# Patient Record
Sex: Female | Born: 1940 | Race: White | Hispanic: No | Smoking: Current every day smoker
Health system: Southern US, Community
[De-identification: ages and names within clinical notes are randomized; demographics above are authoritative.]

## PROBLEM LIST (undated history)

## (undated) DIAGNOSIS — C539 Malignant neoplasm of cervix uteri, unspecified: Secondary | ICD-10-CM

## (undated) DIAGNOSIS — I219 Acute myocardial infarction, unspecified: Secondary | ICD-10-CM

## (undated) DIAGNOSIS — K219 Gastro-esophageal reflux disease without esophagitis: Secondary | ICD-10-CM

## (undated) DIAGNOSIS — Z8489 Family history of other specified conditions: Secondary | ICD-10-CM

## (undated) DIAGNOSIS — Z9289 Personal history of other medical treatment: Secondary | ICD-10-CM

## (undated) DIAGNOSIS — E039 Hypothyroidism, unspecified: Secondary | ICD-10-CM

## (undated) DIAGNOSIS — R55 Syncope and collapse: Secondary | ICD-10-CM

## (undated) DIAGNOSIS — E7800 Pure hypercholesterolemia, unspecified: Secondary | ICD-10-CM

## (undated) DIAGNOSIS — J449 Chronic obstructive pulmonary disease, unspecified: Secondary | ICD-10-CM

## (undated) DIAGNOSIS — Z9981 Dependence on supplemental oxygen: Secondary | ICD-10-CM

## (undated) DIAGNOSIS — I209 Angina pectoris, unspecified: Secondary | ICD-10-CM

## (undated) DIAGNOSIS — I2510 Atherosclerotic heart disease of native coronary artery without angina pectoris: Secondary | ICD-10-CM

## (undated) DIAGNOSIS — J189 Pneumonia, unspecified organism: Secondary | ICD-10-CM

## (undated) DIAGNOSIS — M1990 Unspecified osteoarthritis, unspecified site: Secondary | ICD-10-CM

## (undated) HISTORY — PX: TONSILLECTOMY AND ADENOIDECTOMY: SUR1326

## (undated) HISTORY — PX: FRACTURE SURGERY: SHX138

## (undated) HISTORY — PX: LUNG LOBECTOMY: SHX167

## (undated) HISTORY — PX: KNEE ARTHROSCOPY: SHX127

## (undated) HISTORY — PX: THYROIDECTOMY: SHX17

## (undated) HISTORY — PX: APPENDECTOMY: SHX54

## (undated) HISTORY — PX: CARDIAC CATHETERIZATION: SHX172

---

## 1973-01-06 HISTORY — PX: TUBAL LIGATION: SHX77

## 1991-01-07 DIAGNOSIS — C539 Malignant neoplasm of cervix uteri, unspecified: Secondary | ICD-10-CM

## 1991-01-07 HISTORY — DX: Malignant neoplasm of cervix uteri, unspecified: C53.9

## 1991-01-07 HISTORY — PX: VAGINAL HYSTERECTOMY: SUR661

## 2004-09-19 ENCOUNTER — Ambulatory Visit (HOSPITAL_COMMUNITY): Admission: RE | Admit: 2004-09-19 | Discharge: 2004-09-19 | Payer: Self-pay | Admitting: Cardiovascular Disease

## 2004-09-25 ENCOUNTER — Ambulatory Visit (HOSPITAL_COMMUNITY): Admission: RE | Admit: 2004-09-25 | Discharge: 2004-09-25 | Payer: Self-pay | Admitting: Cardiovascular Disease

## 2004-09-25 ENCOUNTER — Encounter: Admission: RE | Admit: 2004-09-25 | Discharge: 2004-09-25 | Payer: Self-pay | Admitting: Cardiovascular Disease

## 2005-01-25 ENCOUNTER — Encounter: Admission: RE | Admit: 2005-01-25 | Discharge: 2005-01-25 | Payer: Self-pay | Admitting: Cardiovascular Disease

## 2005-02-25 ENCOUNTER — Ambulatory Visit: Payer: Self-pay | Admitting: Internal Medicine

## 2005-04-10 ENCOUNTER — Ambulatory Visit (HOSPITAL_COMMUNITY): Admission: RE | Admit: 2005-04-10 | Discharge: 2005-04-10 | Payer: Self-pay | Admitting: Urology

## 2005-04-15 ENCOUNTER — Ambulatory Visit: Payer: Self-pay | Admitting: Internal Medicine

## 2005-04-18 ENCOUNTER — Ambulatory Visit: Payer: Self-pay | Admitting: Internal Medicine

## 2005-05-15 ENCOUNTER — Ambulatory Visit: Payer: Self-pay | Admitting: Internal Medicine

## 2007-10-04 ENCOUNTER — Encounter: Admission: RE | Admit: 2007-10-04 | Discharge: 2007-10-04 | Payer: Self-pay | Admitting: Cardiovascular Disease

## 2007-10-11 ENCOUNTER — Encounter: Admission: RE | Admit: 2007-10-11 | Discharge: 2007-10-11 | Payer: Self-pay | Admitting: Cardiovascular Disease

## 2007-10-13 ENCOUNTER — Encounter: Admission: RE | Admit: 2007-10-13 | Discharge: 2007-10-13 | Payer: Self-pay | Admitting: Orthopedic Surgery

## 2007-11-18 ENCOUNTER — Ambulatory Visit (HOSPITAL_BASED_OUTPATIENT_CLINIC_OR_DEPARTMENT_OTHER): Admission: RE | Admit: 2007-11-18 | Discharge: 2007-11-18 | Payer: Self-pay | Admitting: Orthopedic Surgery

## 2008-10-28 ENCOUNTER — Encounter: Admission: RE | Admit: 2008-10-28 | Discharge: 2008-10-28 | Payer: Self-pay | Admitting: Cardiovascular Disease

## 2008-11-02 ENCOUNTER — Encounter: Admission: RE | Admit: 2008-11-02 | Discharge: 2008-11-02 | Payer: Self-pay | Admitting: Endocrinology

## 2008-12-06 ENCOUNTER — Encounter: Admission: RE | Admit: 2008-12-06 | Discharge: 2008-12-06 | Payer: Self-pay | Admitting: Cardiovascular Disease

## 2008-12-06 ENCOUNTER — Other Ambulatory Visit: Admission: RE | Admit: 2008-12-06 | Discharge: 2008-12-06 | Payer: Self-pay | Admitting: Interventional Radiology

## 2009-02-20 ENCOUNTER — Inpatient Hospital Stay (HOSPITAL_COMMUNITY): Admission: RE | Admit: 2009-02-20 | Discharge: 2009-02-21 | Payer: Self-pay | Admitting: Otolaryngology

## 2009-02-20 ENCOUNTER — Encounter (INDEPENDENT_AMBULATORY_CARE_PROVIDER_SITE_OTHER): Payer: Self-pay | Admitting: General Surgery

## 2009-02-27 ENCOUNTER — Encounter: Admission: RE | Admit: 2009-02-27 | Discharge: 2009-02-27 | Payer: Self-pay | Admitting: General Surgery

## 2010-03-27 LAB — BASIC METABOLIC PANEL
CO2: 28 mEq/L (ref 19–32)
Calcium: 9.1 mg/dL (ref 8.4–10.5)
Potassium: 3.5 mEq/L (ref 3.5–5.1)

## 2010-03-27 LAB — PROTIME-INR
INR: 0.96 (ref 0.00–1.49)
Prothrombin Time: 12.7 seconds (ref 11.6–15.2)

## 2010-03-27 LAB — CALCIUM: Calcium: 7.8 mg/dL — ABNORMAL LOW (ref 8.4–10.5)

## 2010-03-27 LAB — APTT: aPTT: 30 seconds (ref 24–37)

## 2010-03-27 LAB — CBC
MCV: 92.8 fL (ref 78.0–100.0)
RBC: 4.25 MIL/uL (ref 3.87–5.11)
RDW: 13.7 % (ref 11.5–15.5)
WBC: 5.1 10*3/uL (ref 4.0–10.5)

## 2010-03-27 LAB — DIFFERENTIAL
Eosinophils Relative: 2 % (ref 0–5)
Lymphocytes Relative: 25 % (ref 12–46)
Monocytes Absolute: 0.3 10*3/uL (ref 0.1–1.0)

## 2010-03-27 LAB — ALBUMIN: Albumin: 3.5 g/dL (ref 3.5–5.2)

## 2010-05-21 NOTE — Op Note (Signed)
Carly Jennings, Carly Jennings            ACCOUNT NO.:  [id number]   MEDICAL RECORD NO.:  [id number]          PATIENT TYPE:  AMB   LOCATION:  DSC                          FACILITY:  [facility]   PHYSICIAN:  Rodney A. Mortenson, M.D.DATE OF BIRTH:  November 19, 1940   DATE OF PROCEDURE:  11/18/2007  DATE OF DISCHARGE:                               OPERATIVE REPORT   JUSTIFICATION:  A 70 year old female referred here at the courtesy of  Dr. Orpah Cobb with an 81-month history of pain in her left knee, which  was become progressively worse.  She has pain in the popliteal area and  the medial joint line.  Squatting, stooping is quite painful.  She has  symptoms of mechanical problems with the knee.  There is acute  tenderness along the medial joint line.  No instability about the knee.  Because of persistent pain and discomfort, an MRI of the knee was done  that shows medial meniscal tear involving the posterior horn, which  extends through the anterior surface, but was not displaced with some  early arthritic changes in the knee.  Because of persistent pain and  discomfort, the patient now admitted for arthroscopic evaluation and  treatment.  Complication discussed preoperatively.  Questions were  answered and encouraged.   JUSTIFICATION OF PATIENT SURGERY:  Minimal morbidity.   PREOPERATIVE DIAGNOSIS:  Tear, posterior horn medial meniscus, left  knee.   POSTOPERATIVE DIAGNOSIS:  Unstable tear, posterior horn medial meniscus,  left knee; fraying leading edge, mid third lateral meniscus, left knee.   OPERATION:  Arthroscopy; debride leading edge lateral meniscus left  knee; debride posterior horn medial meniscus, left knee.   SURGEON:  Lenard Galloway. Mortenson, MD   ANESTHESIA:  General.   With the arthroscope of the knee, a very careful examination of the knee  was undertaken.  The patellofemoral joint appeared absolutely normal.  The ACL was normal.  In the lateral compartment, there was normal  articular cartilage over the lateral femoral condyle and lateral tibial  plateau, but there is fraying and tearing of the leading edge of lateral  meniscus, but this did not extend into the body.  In the medial  compartment, there was normal articular cartilage of the femoral condyle  and tibial plateau and initially, the medial meniscus looked absolutely  normal, but was unstable on palpation with a probe.  The meniscus was  left off and there was a big tear on the undersurface of the meniscus,  which extended into the superior surface and the meniscus could be  dragged into the joint.   PROCEDURE:  The patient was placed on the operating table in the supine  position with a pneumatic tourniquet above the left upper thigh.  The  entire left lower extremity was prepped with DuraPrep and draped out in  the usual manner.  An infusion cannula was placed in the superior medial  pouch and the knee distended with saline.  Anteromedial and  anterolateral portals were made and the arthroscope was introduced.  Attention was first turned to the lateral compartment.  The leading edge  of the lateral meniscus was frayed  and this was debrided with the  basket.  This was followed up with intraarticular shaver.  All debris  were removed and the remaining meniscus was smoothed and balanced.  About 75% width of the meniscus and mid third was preserved and the rest  meniscus was normal.  Attention then turned to the medial compartment.  Again posterior horn was very unstable.  A series of baskets were  inserted through both portals and this was extensively debrided.  This  was followed up with the intraarticular shaver.  All debris were  removed.  The remaining rim was then smoothed and balanced a nice  transition to mid third of the medial meniscus.  Excellent decompression  of the tear was achieved.  The knee was then filled with Marcaine.  A  large bulky pressure dressing applied and the patient  returned to  recovery room in excellent condition.  Technically, this procedure went  extremely well.   DISPOSITION:  1. Usual postoperative instructions.  2. Percocet for pain.  3. To my office on Wednesday next week and we will start physical      therapy.      Rodney A. Chaney Malling, M.D.  Electronically Signed     RAM/MEDQ  D:  11/18/2007  T:  11/18/2007  Job:  [id number]   cc:   Ricki Rodriguez, M.D.

## 2010-05-24 NOTE — Cardiovascular Report (Signed)
Carly Jennings, Carly Jennings            ACCOUNT NO.:  [id number]   MEDICAL RECORD NO.:  [id number]          PATIENT TYPE:  OIB   LOCATION:  2899                         FACILITY:  [facility]   PHYSICIAN:  Ricki Rodriguez, M.D.  DATE OF BIRTH:  September 13, 1940   DATE OF PROCEDURE:  09/19/2004  DATE OF DISCHARGE:                              CARDIAC CATHETERIZATION   HOSPITAL LOCATION:  Outpatient Department.   Left heart catheterization, selective coronary angiography, left ventricular  function study.   INDICATION:  This 70 year old white female had chest pressure with abnormal  EKG, hypertension, COPD and hyperlipidemia off and on for the last 6 months.   APPROACH:  Right femoral artery using 5 French sheath and 4 French  catheters.   COMPLICATIONS:  None.   HEMODYNAMIC DATA:  The left ventricular pressure was 123/12, aortic pressure  was 125/70.   LEFT VENTRICULOGRAM:  The left ventriculogram showed mild generalized  hypokinesia with ejection fraction of 50-55%.   CORONARY ANATOMY:  The left main coronary artery was unremarkable.   Left anterior descending coronary artery:  The left anterior descending  coronary artery showed normal vessel which wrapped around the apex of the  heart, supplying half of the posterior septum.  It was elongated due to  chronic obstructive lung disease and the diagonal 1 and 2 vessels were  unremarkable.   Left circumflex coronary artery:  The left circumflex coronary artery was  essentially unremarkable.   Right coronary artery:  The right coronary artery was dominant, had a large  posterolateral branch and had a small posterior descending coronary artery.   IMPRESSION:  1.  Normal coronary arteries.  2.  Mild left ventricular systolic dysfunction.  3.  Noncardiac chest pain.   RECOMMENDATION:  This patient will undergo noncardiac chest pain on an  outpatient basis.      Ricki Rodriguez, M.D.  Electronically Signed     ASK/MEDQ  D:   09/19/2004  T:  09/19/2004  Job:  [id number]

## 2010-12-19 IMAGING — CT CT NECK W/ CM
4 of 5 series · 16 of 33 positions shown, 19 images · IV contrast (75CC OMNI 300)
Comparison: None.

CLINICAL DATA: Thyroidectomy 02/20/2009.  Gasping for air.
Evaluate airway.

CT NECK WITH CONTRAST
TECHNIQUE: Multidetector CT imaging of the neck was performed with
intravenous contrast.
Contrast: 75 ml [id number]-XNN IV.

[Series 3: axial neck · axial · 0.37mm/px · z∈[-229,-114]mm · 3 of 115 slices shown (1 of 2)]
[im 23/115  bone]
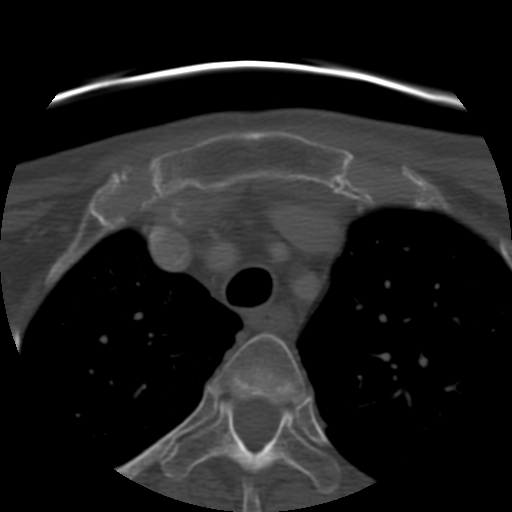
[im 46/115  bone]
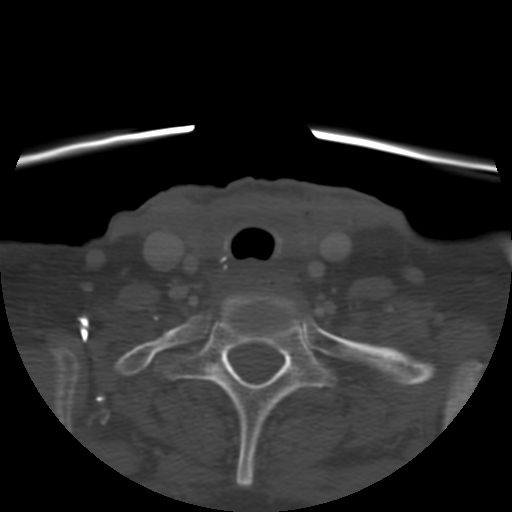
[im 69/115  bone]
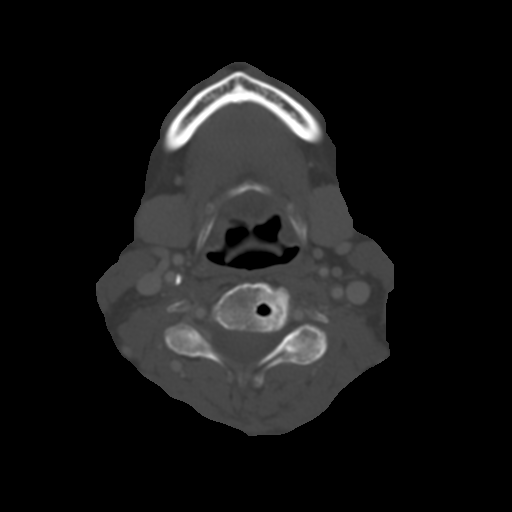

[Series 200: cor neck · coronal · 0.57mm/px · 3 of 74 slices shown]
[im 15/74  bone]
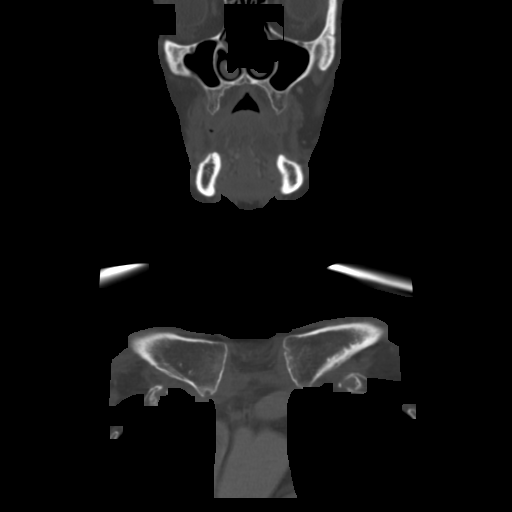
[im 30/74  bone]
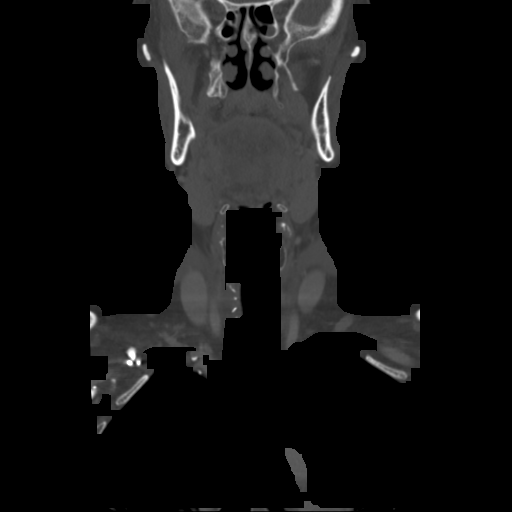
[im 44/74  bone]
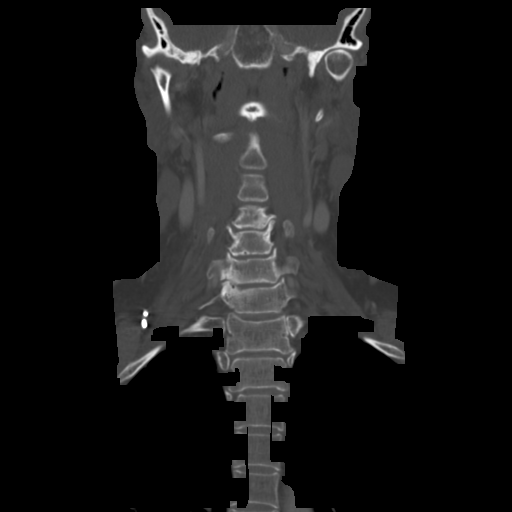

[Series 201: sag neck · sagittal · 0.57mm/px · 5 of 83 slices shown, 6 images]
[im 28/83  bone]
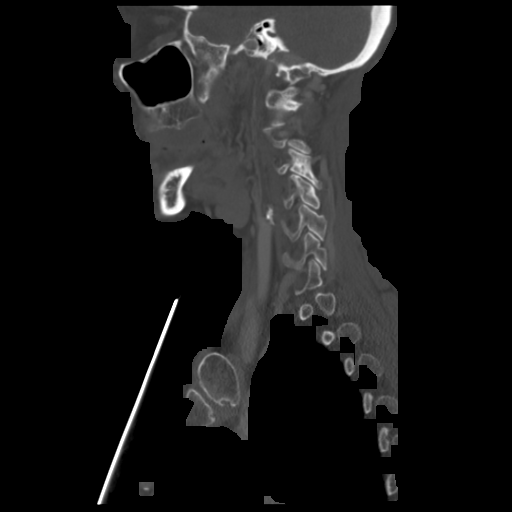
[im 35/83  bone]
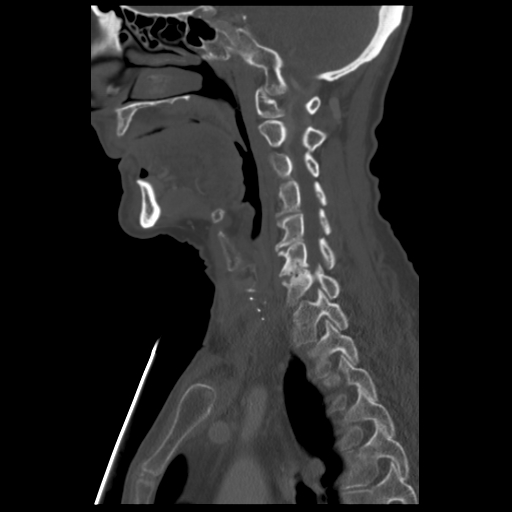
[im 42/83  soft-tissue]
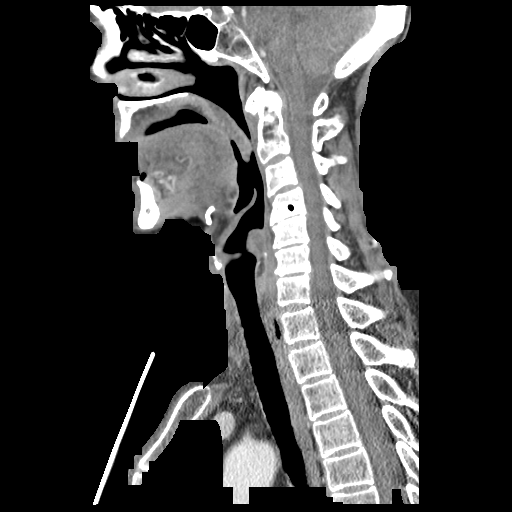
[im 42/83  bone]
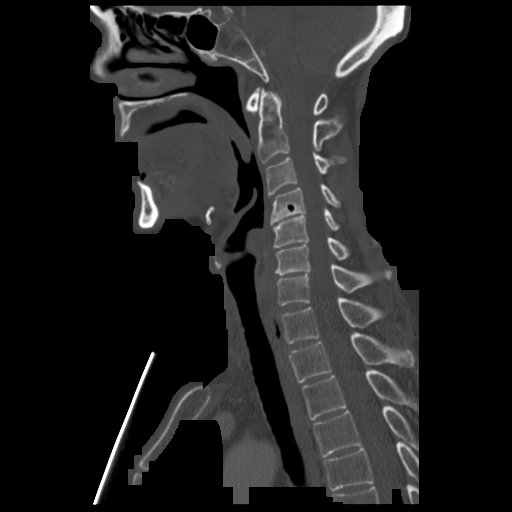
[im 48/83  bone]
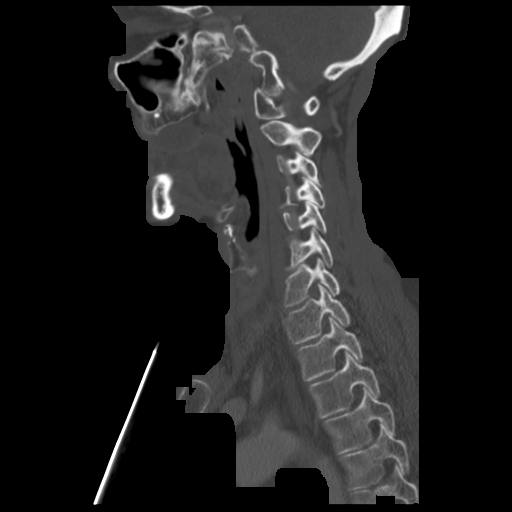
[im 55/83  bone]
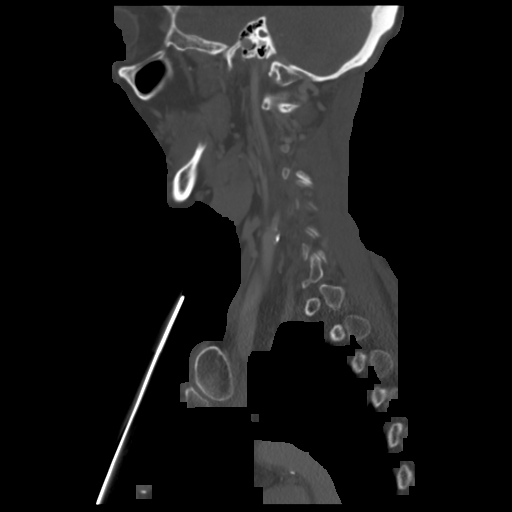

[Series 202: axial neck · axial · 0.39mm/px · z∈[-270,-62]mm · 5 of 157 slices shown, 7 images (2 of 2)]
[im 27/157  soft-tissue]
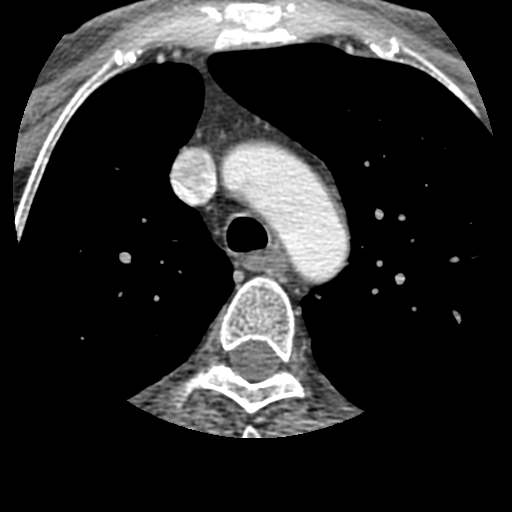
[im 27/157  bone]
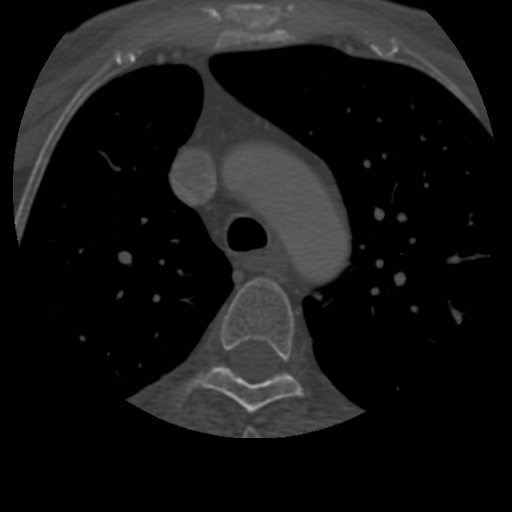
[im 53/157  bone]
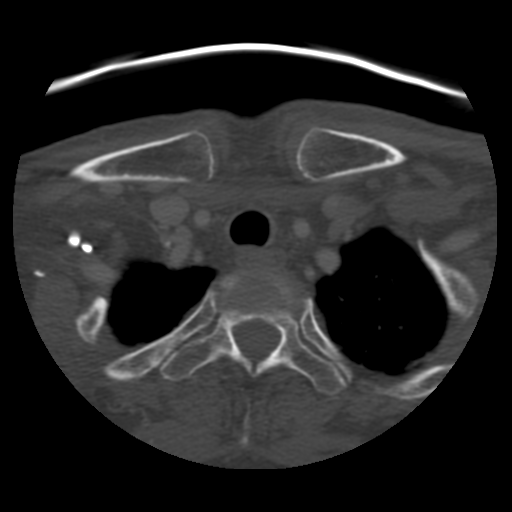
[im 79/157  bone]
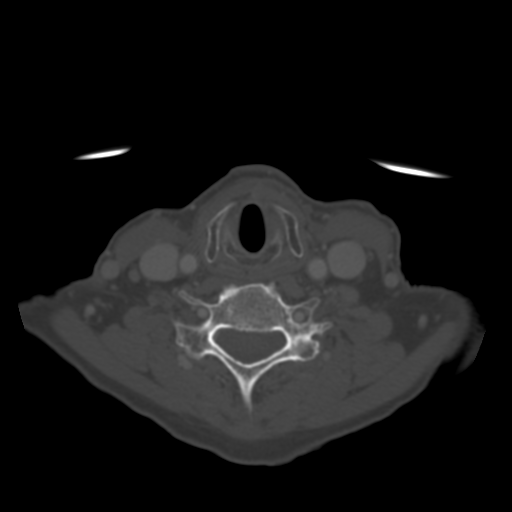
[im 105/157  bone]
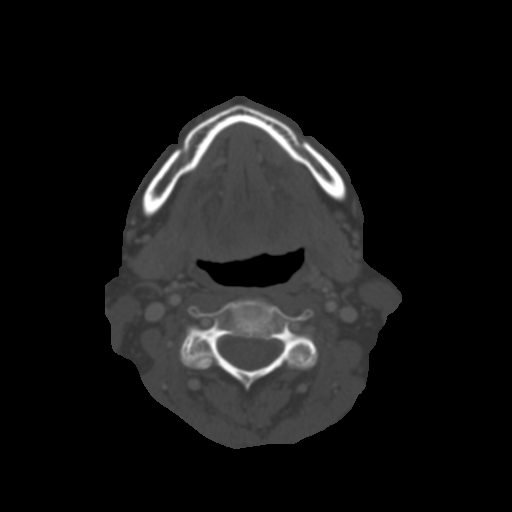
[im 131/157  soft-tissue]
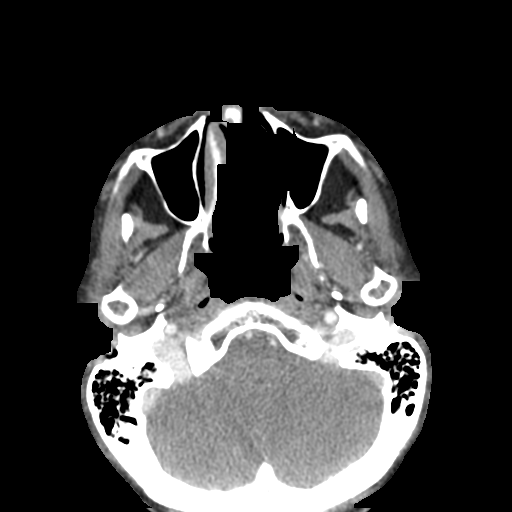
[im 131/157  bone]
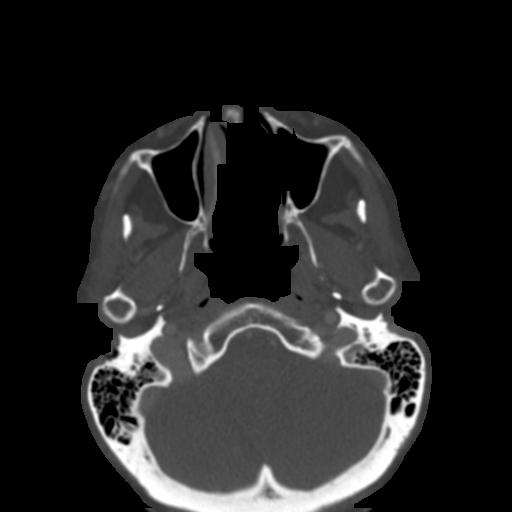

[16 of 33 positions shown; findings below may reference images not displayed]

FINDINGS: There appears to have been total thyroidectomy.  There is
a mild amount of fluid density in the thyroid bed bilaterally.
This fluid density measures approximately 10 x 17 mm on the right
and 9 x 16 mm on the left.  This also extends across midline in the
isthmus region.  This appears to be a simple postoperative
collection.  There is no mass effect on the trachea and I do not
believe this represents an abscess.  The remainder of the airway is
normal without mass effect or significant narrowing.  The larynx is
normal.

The oral cavity and pharynx is normal.  The salivary glands are
normal.  There is no pathologic adenopathy.  There is significant
atherosclerotic calcification in the carotid arteries bilaterally.
Cervical disc degeneration and spondylosis at C4-5, C5-6, and C6-7.
Jugular vein is patent bilaterally.
IMPRESSION: Status post bilateral thyroidectomy.  Fluid density is present in
the thyroid bed bilaterally.  This appears to be simple
postoperative fluid, abscess is not felt to be present.  There is
no mass effect on the airway.

## 2012-12-12 ENCOUNTER — Encounter (HOSPITAL_COMMUNITY): Payer: Self-pay

## 2012-12-12 ENCOUNTER — Inpatient Hospital Stay (HOSPITAL_COMMUNITY)
Admission: AD | Admit: 2012-12-12 | Discharge: 2012-12-13 | DRG: 287 | Disposition: A | Discharge status: Home / Self Care | Payer: Medicare Other | Source: Other Acute Inpatient Hospital | Attending: Cardiovascular Disease | Admitting: Cardiovascular Disease

## 2012-12-12 DIAGNOSIS — Z79899 Other long term (current) drug therapy: Secondary | ICD-10-CM

## 2012-12-12 DIAGNOSIS — J4489 Other specified chronic obstructive pulmonary disease: Secondary | ICD-10-CM | POA: Diagnosis present

## 2012-12-12 DIAGNOSIS — I249 Acute ischemic heart disease, unspecified: Secondary | ICD-10-CM | POA: Diagnosis present

## 2012-12-12 DIAGNOSIS — R0789 Other chest pain: Principal | ICD-10-CM | POA: Diagnosis present

## 2012-12-12 DIAGNOSIS — Z7982 Long term (current) use of aspirin: Secondary | ICD-10-CM

## 2012-12-12 DIAGNOSIS — I959 Hypotension, unspecified: Secondary | ICD-10-CM | POA: Diagnosis present

## 2012-12-12 DIAGNOSIS — F329 Major depressive disorder, single episode, unspecified: Secondary | ICD-10-CM | POA: Diagnosis present

## 2012-12-12 DIAGNOSIS — E039 Hypothyroidism, unspecified: Secondary | ICD-10-CM | POA: Diagnosis present

## 2012-12-12 DIAGNOSIS — J449 Chronic obstructive pulmonary disease, unspecified: Secondary | ICD-10-CM | POA: Diagnosis present

## 2012-12-12 DIAGNOSIS — F411 Generalized anxiety disorder: Secondary | ICD-10-CM | POA: Diagnosis present

## 2012-12-12 DIAGNOSIS — F3289 Other specified depressive episodes: Secondary | ICD-10-CM | POA: Diagnosis present

## 2012-12-12 HISTORY — DX: Angina pectoris, unspecified: I20.9

## 2012-12-12 HISTORY — DX: Atherosclerotic heart disease of native coronary artery without angina pectoris: I25.10

## 2012-12-12 HISTORY — DX: Chronic obstructive pulmonary disease, unspecified: J44.9

## 2012-12-12 HISTORY — DX: Acute myocardial infarction, unspecified: I21.9

## 2012-12-12 LAB — URINE MICROSCOPIC-ADD ON

## 2012-12-12 LAB — HEPARIN LEVEL (UNFRACTIONATED): Heparin Unfractionated: 0.52 IU/mL (ref 0.30–0.70)

## 2012-12-12 LAB — BASIC METABOLIC PANEL
CO2: 22 mEq/L (ref 19–32)
Chloride: 104 mEq/L (ref 96–112)
Creatinine, Ser: 0.7 mg/dL (ref 0.50–1.10)
GFR calc Af Amer: >90 mL/min (ref >90)
GFR calc non Af Amer: 86 mL/min — ABNORMAL LOW (ref >90)
Potassium: 3.5 mEq/L (ref 3.5–5.1)

## 2012-12-12 LAB — PROTIME-INR
INR: 1.02 (ref 0.00–1.49)
Prothrombin Time: 13.2 seconds (ref 11.6–15.2)

## 2012-12-12 LAB — TROPONIN I
Troponin I: <0.3 ng/mL (ref <0.30)
Troponin I: <0.3 ng/mL (ref <0.30)

## 2012-12-12 LAB — LIPID PANEL
Cholesterol: 136 mg/dL (ref 0–200)
LDL Cholesterol: 74 mg/dL (ref 0–99)
Triglycerides: 45 mg/dL (ref <150)
VLDL: 9 mg/dL (ref 0–40)

## 2012-12-12 LAB — URINALYSIS, ROUTINE W REFLEX MICROSCOPIC
Glucose, UA: NEGATIVE mg/dL
Ketones, ur: NEGATIVE mg/dL
Nitrite: NEGATIVE
Protein, ur: NEGATIVE mg/dL
Urobilinogen, UA: 1 mg/dL (ref 0.0–1.0)

## 2012-12-12 LAB — CBC
Platelets: 230 10*3/uL (ref 150–400)
RBC: 4.09 MIL/uL (ref 3.87–5.11)
RDW: 13.1 % (ref 11.5–15.5)
WBC: 5.6 10*3/uL (ref 4.0–10.5)

## 2012-12-12 MED ORDER — IPRATROPIUM-ALBUTEROL 20-100 MCG/ACT IN AERS
2.0000 | INHALATION_SPRAY | Freq: Four times a day (QID) | RESPIRATORY_TRACT | Status: DC
  Administered 2012-12-12: 2 via RESPIRATORY_TRACT
  Filled 2012-12-12: qty 4

## 2012-12-12 MED ORDER — SODIUM CHLORIDE 0.9 % IV SOLN: 250.0000 mL | INTRAVENOUS | Status: DC | PRN

## 2012-12-12 MED ORDER — ALPRAZOLAM 0.25 MG PO TABS: 0.2500 mg | ORAL_TABLET | Freq: Two times a day (BID) | ORAL | Status: DC | PRN

## 2012-12-12 MED ORDER — SODIUM CHLORIDE 0.9 % IJ SOLN: 3.0000 mL | Freq: Two times a day (BID) | INTRAMUSCULAR | Status: DC

## 2012-12-12 MED ORDER — SODIUM CHLORIDE 0.9 % IJ SOLN: 3.0000 mL | INTRAMUSCULAR | Status: DC | PRN

## 2012-12-12 MED ORDER — DIAZEPAM 5 MG PO TABS
5.0000 mg | ORAL_TABLET | ORAL | Status: DC
  Filled 2012-12-12: qty 1

## 2012-12-12 MED ORDER — ACETAMINOPHEN 325 MG PO TABS
650.0000 mg | ORAL_TABLET | ORAL | Status: DC | PRN
  Administered 2012-12-12: 650 mg via ORAL
  Filled 2012-12-12: qty 2

## 2012-12-12 MED ORDER — ALBUTEROL SULFATE (5 MG/ML) 0.5% IN NEBU: 2.5000 mg | INHALATION_SOLUTION | Freq: Four times a day (QID) | RESPIRATORY_TRACT | Status: DC | PRN

## 2012-12-12 MED ORDER — HEPARIN (PORCINE) IN NACL 100-0.45 UNIT/ML-% IJ SOLN
1000.0000 [IU]/h | INTRAMUSCULAR | Status: DC
  Administered 2012-12-12 (×2): 1000 [IU]/h via INTRAVENOUS
  Filled 2012-12-12 (×4): qty 250

## 2012-12-12 MED ORDER — SODIUM CHLORIDE 0.9 % IJ SOLN
3.0000 mL | Freq: Two times a day (BID) | INTRAMUSCULAR | Status: DC
  Administered 2012-12-12: 3 mL via INTRAVENOUS

## 2012-12-12 MED ORDER — ONDANSETRON HCL 4 MG/2ML IJ SOLN: 4.0000 mg | Freq: Four times a day (QID) | INTRAMUSCULAR | Status: DC | PRN

## 2012-12-12 MED ORDER — TRAZODONE HCL 100 MG PO TABS
100.0000 mg | ORAL_TABLET | Freq: Every day | ORAL | Status: DC
  Administered 2012-12-12 (×2): 100 mg via ORAL
  Filled 2012-12-12 (×3): qty 1

## 2012-12-12 MED ORDER — ASPIRIN EC 81 MG PO TBEC: 81.0000 mg | DELAYED_RELEASE_TABLET | Freq: Every day | ORAL | Status: DC

## 2012-12-12 MED ORDER — SODIUM CHLORIDE 0.9 % IV SOLN
INTRAVENOUS | Status: DC
  Administered 2012-12-12: 10:00:00 via INTRAVENOUS
  Administered 2012-12-13: 50 mL via INTRAVENOUS

## 2012-12-12 MED ORDER — IPRATROPIUM-ALBUTEROL 20-100 MCG/ACT IN AERS
2.0000 | INHALATION_SPRAY | Freq: Two times a day (BID) | RESPIRATORY_TRACT | Status: DC
  Administered 2012-12-12 – 2012-12-13 (×2): 2 via RESPIRATORY_TRACT

## 2012-12-12 MED ORDER — ASPIRIN 81 MG PO CHEW: 324.0000 mg | CHEWABLE_TABLET | ORAL | Status: AC

## 2012-12-12 MED ORDER — METOPROLOL TARTRATE 25 MG PO TABS
25.0000 mg | ORAL_TABLET | Freq: Two times a day (BID) | ORAL | Status: DC
  Administered 2012-12-12: 25 mg via ORAL
  Filled 2012-12-12 (×5): qty 1

## 2012-12-12 MED ORDER — LEVOTHYROXINE SODIUM 200 MCG PO TABS
200.0000 ug | ORAL_TABLET | Freq: Every day | ORAL | Status: DC
  Administered 2012-12-12 – 2012-12-13 (×2): 200 ug via ORAL
  Filled 2012-12-12 (×3): qty 1

## 2012-12-12 MED ORDER — HEPARIN BOLUS VIA INFUSION
4000.0000 [IU] | Freq: Once | INTRAVENOUS | Status: AC
  Administered 2012-12-12: 4000 [IU] via INTRAVENOUS
  Filled 2012-12-12: qty 4000

## 2012-12-12 MED ORDER — PANTOPRAZOLE SODIUM 40 MG PO TBEC
40.0000 mg | DELAYED_RELEASE_TABLET | Freq: Every day | ORAL | Status: DC
  Administered 2012-12-12 – 2012-12-13 (×2): 40 mg via ORAL
  Filled 2012-12-12 (×2): qty 1

## 2012-12-12 MED ORDER — NITROGLYCERIN 0.4 MG SL SUBL: 0.4000 mg | SUBLINGUAL_TABLET | SUBLINGUAL | Status: DC | PRN

## 2012-12-12 MED ORDER — AMLODIPINE BESYLATE 2.5 MG PO TABS
2.5000 mg | ORAL_TABLET | Freq: Every day | ORAL | Status: DC
  Filled 2012-12-12: qty 1

## 2012-12-12 MED ORDER — ASPIRIN 300 MG RE SUPP
300.0000 mg | RECTAL | Status: AC
  Filled 2012-12-12: qty 1

## 2012-12-12 MED ORDER — ATORVASTATIN CALCIUM 40 MG PO TABS
40.0000 mg | ORAL_TABLET | Freq: Every day | ORAL | Status: DC
  Administered 2012-12-12: 40 mg via ORAL
  Filled 2012-12-12 (×2): qty 1

## 2012-12-12 NOTE — Progress Notes (Signed)
ANTICOAGULATION CONSULT NOTE - Initial Consult  Pharmacy Consult for heparin Indication: chest pain/ACS  Allergies not on file  Patient Measurements: Height: 5\' 11"  (180.3 cm) Weight: 174 lb 9.7 oz (79.2 kg) IBW/kg (Calculated) : 70.8  Vital Signs: Temp: 98.1 F (36.7 C) (12/07 0000) Temp src: Oral (12/07 0000) BP: 113/52 mmHg (12/07 0000)    Assessment: 72yo female comes from OSH c/o CP, initial troponin negative, to begin heparin.  Goal of Therapy:  Heparin level 0.3-0.7 units/ml Monitor platelets by anticoagulation protocol: Yes   Plan:  Will give heparin 4000 units IV bolus x1 followed by gtt at 1000 units/hr and monitor heparin levels and CBC.  Vernard Gambles, PharmD, BCPS  12/12/2012,12:35 AM

## 2012-12-12 NOTE — Progress Notes (Signed)
ANTICOAGULATION CONSULT NOTE - Follow up Consult  Pharmacy Consult for heparin Indication: chest pain/ACS  Allergies  Allergen Reactions  . Penicillins Anaphylaxis  . Packed Cells     Reaction to blood transfusion in 93    Patient Measurements: Height: 5\' 11"  (180.3 cm) Weight: 174 lb 9.7 oz (79.2 kg) IBW/kg (Calculated) : 70.8  Vital Signs: Temp: 98.6 F (37 C) (12/07 0748) Temp src: Oral (12/07 0748) BP: 89/47 mmHg (12/07 1030) Pulse Rate: 60 (12/07 0800)    Assessment: 72yo female comes from OSH c/o CP.  Heparin drip 1000 uts/hr HL 0.52.  CBC stable, Tp neg x3.  no bleedig noted.  BP soft 90/40. RUE45, statin, metop, plan cath in am.    Goal of Therapy:  Heparin level 0.3-0.7 units/ml Monitor platelets by anticoagulation protocol: Yes   Plan:  Heparin drip 1000 units/hr   Daily heparin levels and CBC    Leota Sauers Pharm.D. CPP, BCPS Clinical Pharmacist [phone] 12/12/2012 11:14 AM

## 2012-12-12 NOTE — Progress Notes (Signed)
  Echocardiogram 2D Echocardiogram has been performed.  Georgian Co 12/12/2012, 10:17 AM

## 2012-12-12 NOTE — Progress Notes (Signed)
Called to room due to IV bleeding from site. Assessed and IV removed, catheter intact and dressing applied. Heparin and fluids were on hold for roughly 15 minutes while restarting another site. Will continue to monitor new site.

## 2012-12-12 NOTE — Progress Notes (Signed)
Subjective:  Feeling better. Low blood pressure. Cardiac enzymes normal x 2. Afebrile.  Objective:  Vital Signs in the last 24 hours: Temp:  [97.8 F (36.6 C)-98.6 F (37 C)] 98.6 F (37 C) (12/07 0748) Pulse Rate:  [60-73] 60 (12/07 0800) Cardiac Rhythm:  [-] Heart block (12/07 0800) Resp:  [11-17] 16 (12/07 0900) BP: (80-117)/(39-72) 94/44 mmHg (12/07 0900) SpO2:  [92 %-99 %] 96 % (12/07 0900) Weight:  [79.2 kg (174 lb 9.7 oz)] 79.2 kg (174 lb 9.7 oz) (12/07 0500)  Physical Exam: BP Readings from Last 1 Encounters:  12/12/12 94/44     Wt Readings from Last 1 Encounters:  12/12/12 79.2 kg (174 lb 9.7 oz)    Weight change:   HEENT: [place]/AT, Eyes-PERL, EOMI, Conjunctiva-Pink, Sclera-Non-icteric Neck: No JVD, No bruit, Trachea midline. Lungs:  Clear, Bilateral. Cardiac:  Regular rhythm, normal S1 and S2, no S3.  Abdomen:  Soft, non-tender. Extremities:  No edema present. No cyanosis. No clubbing. CNS: AxOx3, Cranial nerves grossly intact, moves all 4 extremities. Right handed. Skin: Warm and dry.   Intake/Output from previous day: 12/06 0701 - 12/07 0700 In: 180.7 [P.O.:120; I.V.:60.7] Out: 200 [Urine:200]    Lab Results: BMET    Component Value Date/Time   NA 140 12/12/2012 0140   K 3.5 12/12/2012 0140   CL 104 12/12/2012 0140   CO2 22 12/12/2012 0140   GLUCOSE 107* 12/12/2012 0140   BUN 19 12/12/2012 0140   CREATININE 0.70 12/12/2012 0140   CALCIUM 8.9 12/12/2012 0140   GFRNONAA 86* 12/12/2012 0140   GFRAA >90 12/12/2012 0140   CBC    Component Value Date/Time   WBC 5.6 12/12/2012 0140   RBC 4.09 12/12/2012 0140   HGB 12.4 12/12/2012 0140   HCT 37.5 12/12/2012 0140   PLT 230 12/12/2012 0140   MCV 91.7 12/12/2012 0140   MCH 30.3 12/12/2012 0140   MCHC 33.1 12/12/2012 0140   RDW 13.1 12/12/2012 0140   LYMPHSABS 1.3 02/14/2009 1113   MONOABS 0.3 02/14/2009 1113   EOSABS 0.1 02/14/2009 1113   BASOSABS 0.0 02/14/2009 1113   CARDIAC ENZYMES Lab Results  Component Value Date   TROPONINI <0.30 12/12/2012    Scheduled Meds: . aspirin  324 mg Oral NOW   Or  . aspirin  300 mg Rectal NOW  . [START ON 12/13/2012] aspirin EC  81 mg Oral Daily  . atorvastatin  40 mg Oral q1800  . Ipratropium-Albuterol  2 puff Inhalation BID  . levothyroxine  200 mcg Oral QAC breakfast  . metoprolol tartrate  25 mg Oral BID  . pantoprazole  40 mg Oral Daily  . sodium chloride  3 mL Intravenous Q12H  . traZODone  100 mg Oral QHS   Continuous Infusions: . sodium chloride    . heparin 1,000 Units/hr (12/12/12 0056)   PRN Meds:.sodium chloride, acetaminophen, albuterol, ALPRAZolam, nitroGLYCERIN, ondansetron (ZOFRAN) IV, sodium chloride  Assessment/Plan: Chest pain R/O CAD COPD Hypothyroidism  Cardiac cath in AM. DC amlodipine. IV fluids Echocardiogram for LV function.   LOS: 0 days    Redith Drach  MD  12/12/2012, 9:20 AM      

## 2012-12-12 NOTE — H&P (Signed)
Carly Jennings is an 72 y.o. female.   Chief Complaint: Chest pain HPI: 72 year old female with recurrent retrosternal chest pain assocoated with sweating spell and shortness of breath.   Past medical history : No diabetes, + hypothyroidism, + smoking, No alcohol or drug use. No hyperlipidemia, No family history of premature coronary artery disease.    No past surgical history on file.:Cardiac cath-2006-Normal coronaries. Left knee surgery 11/18/2007, Thyroid surgery-02/20/2009  No family history on file. Social History:  has no alcohol, and drug history on file. + smoking 1 pk cigarette per day.    Allergies: Allergies not on file  No prescriptions prior to admission    No results found for this or any previous visit (from the past 48 hour(s)). No results found.  ROS No weight gain, loss. Wears glasses, No hearing loss. No GI or GU bleed. No stroke or seizures. + COPD, + Chest pain Blood pressure 113/52, temperature 98.1 F (36.7 C), temperature source Oral, resp. rate 16, height 5\' 11"  (1.803 m), weight 79.2 kg (174 lb 9.7 oz), SpO2 93.00%. General appearance: alert, appears stated age and mild distress Head: Normocephalic, without obvious abnormality, atraumatic Eyes: conjunctivae/corneas clear. PERRL, EOM's intact. Neck: no adenopathy, no carotid bruit, no JVD, thyroid not palpable Resp: clear to auscultation bilaterally Cardio: regular rate and rhythm, S1, S2 normal, no murmur, click, rub or gallop GI: soft, non-tender; bowel sounds normal; no masses,  no organomegaly Extremities: extremities normal, atraumatic, no cyanosis or edema Skin: Warm and dry.  Assessment/Plan Chest pain R/O CAD COPD Hypothyroidism  Cardiac cath on Monday.  Tyffani Foglesong S 12/12/2012, 12:36 AM

## 2012-12-13 ENCOUNTER — Encounter (HOSPITAL_COMMUNITY)
Admission: AD | Disposition: A | Discharge status: Home / Self Care | Payer: Self-pay | Source: Other Acute Inpatient Hospital | Attending: Cardiovascular Disease

## 2012-12-13 HISTORY — PX: LEFT HEART CATHETERIZATION WITH CORONARY ANGIOGRAM: SHX5451

## 2012-12-13 LAB — CBC
HCT: 34.4 % — ABNORMAL LOW (ref 36.0–46.0)
MCH: 29.7 pg (ref 26.0–34.0)
MCHC: 32.6 g/dL (ref 30.0–36.0)
MCV: 91.2 fL (ref 78.0–100.0)
Platelets: 197 10*3/uL (ref 150–400)
RBC: 3.77 MIL/uL — ABNORMAL LOW (ref 3.87–5.11)

## 2012-12-13 LAB — PROTIME-INR
INR: 1.02 (ref 0.00–1.49)
Prothrombin Time: 13.2 seconds (ref 11.6–15.2)

## 2012-12-13 LAB — BASIC METABOLIC PANEL
BUN: 21 mg/dL (ref 6–23)
CO2: 25 mEq/L (ref 19–32)
Calcium: 8.2 mg/dL — ABNORMAL LOW (ref 8.4–10.5)
Creatinine, Ser: 0.82 mg/dL (ref 0.50–1.10)
GFR calc non Af Amer: 71 mL/min — ABNORMAL LOW (ref >90)
Sodium: 141 mEq/L (ref 135–145)

## 2012-12-13 SURGERY — LEFT HEART CATHETERIZATION WITH CORONARY ANGIOGRAM
Anesthesia: LOCAL

## 2012-12-13 MED ORDER — ALPRAZOLAM 0.25 MG PO TABS: 0.2500 mg | ORAL_TABLET | Freq: Two times a day (BID) | ORAL | Status: DC | PRN

## 2012-12-13 MED ORDER — SODIUM CHLORIDE 0.9 % IV SOLN: INTRAVENOUS | Status: DC

## 2012-12-13 MED ORDER — MECLIZINE HCL 25 MG PO TABS: 25.0000 mg | ORAL_TABLET | Freq: Three times a day (TID) | ORAL | Status: AC | PRN

## 2012-12-13 MED ORDER — ACETAMINOPHEN 325 MG PO TABS: 650.0000 mg | ORAL_TABLET | ORAL | Status: DC | PRN

## 2012-12-13 MED ORDER — TRAZODONE HCL 100 MG PO TABS: 100.0000 mg | ORAL_TABLET | Freq: Two times a day (BID) | ORAL | Status: AC

## 2012-12-13 MED ORDER — LIDOCAINE HCL (PF) 1 % IJ SOLN
INTRAMUSCULAR | Status: AC
  Filled 2012-12-13: qty 30

## 2012-12-13 MED ORDER — ASPIRIN EC 81 MG PO TBEC
81.0000 mg | DELAYED_RELEASE_TABLET | Freq: Every day | ORAL | Status: DC
  Administered 2012-12-13: 81 mg via ORAL
  Filled 2012-12-13: qty 1

## 2012-12-13 MED ORDER — IPRATROPIUM-ALBUTEROL 18-103 MCG/ACT IN AERO: 2.0000 | INHALATION_SPRAY | Freq: Four times a day (QID) | RESPIRATORY_TRACT | Status: AC | PRN

## 2012-12-13 MED ORDER — ASPIRIN 81 MG PO CHEW
CHEWABLE_TABLET | ORAL | Status: AC
  Filled 2012-12-13: qty 1

## 2012-12-13 MED ORDER — LEVOTHYROXINE SODIUM 200 MCG PO TABS: 200.0000 ug | ORAL_TABLET | Freq: Every day | ORAL | Status: AC

## 2012-12-13 MED ORDER — ALBUTEROL SULFATE (2.5 MG/3ML) 0.083% IN NEBU: 2.5000 mg | INHALATION_SOLUTION | Freq: Four times a day (QID) | RESPIRATORY_TRACT | Status: AC | PRN

## 2012-12-13 MED ORDER — ONDANSETRON HCL 4 MG/2ML IJ SOLN: 4.0000 mg | Freq: Four times a day (QID) | INTRAMUSCULAR | Status: DC | PRN

## 2012-12-13 MED ORDER — METOPROLOL TARTRATE 25 MG PO TABS: 25.0000 mg | ORAL_TABLET | Freq: Two times a day (BID) | ORAL | Status: AC

## 2012-12-13 MED ORDER — LUBIPROSTONE 24 MCG PO CAPS: 24.0000 ug | ORAL_CAPSULE | Freq: Two times a day (BID) | ORAL | Status: DC

## 2012-12-13 MED ORDER — ATORVASTATIN CALCIUM 40 MG PO TABS: 40.0000 mg | ORAL_TABLET | Freq: Every day | ORAL | Status: AC

## 2012-12-13 MED ORDER — NITROGLYCERIN 0.2 MG/ML ON CALL CATH LAB
INTRAVENOUS | Status: AC
  Filled 2012-12-13: qty 1

## 2012-12-13 MED ORDER — FLUTICASONE-SALMETEROL 100-50 MCG/DOSE IN AEPB: 1.0000 | INHALATION_SPRAY | Freq: Two times a day (BID) | RESPIRATORY_TRACT | Status: AC

## 2012-12-13 MED ORDER — RANITIDINE HCL 75 MG PO TABS: 75.0000 mg | ORAL_TABLET | Freq: Every day | ORAL | Status: AC | PRN

## 2012-12-13 MED ORDER — AMLODIPINE BESYLATE 2.5 MG PO TABS: 2.5000 mg | ORAL_TABLET | Freq: Every day | ORAL | Status: AC

## 2012-12-13 MED ORDER — FENTANYL CITRATE 0.05 MG/ML IJ SOLN
INTRAMUSCULAR | Status: AC
  Filled 2012-12-13: qty 2

## 2012-12-13 MED ORDER — DIPHENHYDRAMINE HCL 25 MG PO TABS: 25.0000 mg | ORAL_TABLET | ORAL | Status: AC | PRN

## 2012-12-13 MED ORDER — MIDAZOLAM HCL 2 MG/2ML IJ SOLN
INTRAMUSCULAR | Status: AC
  Filled 2012-12-13: qty 2

## 2012-12-13 MED ORDER — HEPARIN (PORCINE) IN NACL 2-0.9 UNIT/ML-% IJ SOLN
INTRAMUSCULAR | Status: AC
  Filled 2012-12-13: qty 1500

## 2012-12-13 NOTE — CV Procedure (Signed)
PROCEDURE:  Left heart catheterization with selective coronary angiography, left ventriculogram.  CLINICAL HISTORY:  This is a 72 year old female had typical angina with shortness of breath and sweating spell with cardiac risk factor of smoking.  The risks, benefits, and details of the procedure were explained to the patient.  The patient verbalized understanding and wanted to proceed.  Informed written consent was obtained.  PROCEDURE TECHNIQUE:  The patient was approached from the right femoral artery using a 5 French short sheath.  Left coronary angiography was done using a Judkins L4 guide catheter.  Right coronary angiography was done using a Judkins R4 guide catheter.  Left ventriculography was done using a pigtail catheter.    CONTRAST:  Total of 45 cc.  COMPLICATIONS:  None.  At the end of the procedure a manual device was used for hemostasis.    HEMODYNAMICS:  Aortic pressure was 113/60; LV pressure was 116/11; LVEDP 15.  There was no gradient between the left ventricle and aorta.    ANGIOGRAM/CORONARY ARTERIOGRAM:   The left main coronary artery is unremarkable.  The left anterior descending artery has proximal luminal irregularities only. Diagonal vessel is unremarkable.  The left circumflex artery is unremarkable.  The right coronary artery is dominant and unremarkable.  LEFT VENTRICULOGRAM:  Left ventricular angiogram was done in the 30 RAO projection and revealed mild generalized hypokinesia of left ventricular wall with an estimated ejection fraction of 50%.  LVEDP was 15 mmHg.  IMPRESSION OF HEART CATHETERIZATION:   1. Normal left main coronary artery. 2. Minimal disease of left anterior descending artery and normal diagonal branches. 3. Normal left circumflex artery and its branches. 4. Normal right coronary artery. 5. Mild left ventricular systolic dysfunction.  LVEDP 15 mmHg.  Ejection fraction 50%.  RECOMMENDATION:   Medical treatment of non-cardiac chest  pain.

## 2012-12-13 NOTE — Progress Notes (Signed)
Reviewed discharge instructions with patient. IVs removed and patient awaiting transportation to home.  Carly Jennings, Charlaine Dalton RN

## 2012-12-13 NOTE — H&P (View-Only) (Signed)
Subjective:  Feeling better. Low blood pressure. Cardiac enzymes normal x 2. Afebrile.  Objective:  Vital Signs in the last 24 hours: Temp:  [97.8 F (36.6 C)-98.6 F (37 C)] 98.6 F (37 C) (12/07 0748) Pulse Rate:  [60-73] 60 (12/07 0800) Cardiac Rhythm:  [-] Heart block (12/07 0800) Resp:  [11-17] 16 (12/07 0900) BP: (80-117)/(39-72) 94/44 mmHg (12/07 0900) SpO2:  [92 %-99 %] 96 % (12/07 0900) Weight:  [79.2 kg (174 lb 9.7 oz)] 79.2 kg (174 lb 9.7 oz) (12/07 0500)  Physical Exam: BP Readings from Last 1 Encounters:  12/12/12 94/44     Wt Readings from Last 1 Encounters:  12/12/12 79.2 kg (174 lb 9.7 oz)    Weight change:   HEENT: [place]/AT, Eyes-PERL, EOMI, Conjunctiva-Pink, Sclera-Non-icteric Neck: No JVD, No bruit, Trachea midline. Lungs:  Clear, Bilateral. Cardiac:  Regular rhythm, normal S1 and S2, no S3.  Abdomen:  Soft, non-tender. Extremities:  No edema present. No cyanosis. No clubbing. CNS: AxOx3, Cranial nerves grossly intact, moves all 4 extremities. Right handed. Skin: Warm and dry.   Intake/Output from previous day: 12/06 0701 - 12/07 0700 In: 180.7 [P.O.:120; I.V.:60.7] Out: 200 [Urine:200]    Lab Results: BMET    Component Value Date/Time   NA 140 12/12/2012 0140   K 3.5 12/12/2012 0140   CL 104 12/12/2012 0140   CO2 22 12/12/2012 0140   GLUCOSE 107* 12/12/2012 0140   BUN 19 12/12/2012 0140   CREATININE 0.70 12/12/2012 0140   CALCIUM 8.9 12/12/2012 0140   GFRNONAA 86* 12/12/2012 0140   GFRAA >90 12/12/2012 0140   CBC    Component Value Date/Time   WBC 5.6 12/12/2012 0140   RBC 4.09 12/12/2012 0140   HGB 12.4 12/12/2012 0140   HCT 37.5 12/12/2012 0140   PLT 230 12/12/2012 0140   MCV 91.7 12/12/2012 0140   MCH 30.3 12/12/2012 0140   MCHC 33.1 12/12/2012 0140   RDW 13.1 12/12/2012 0140   LYMPHSABS 1.3 02/14/2009 1113   MONOABS 0.3 02/14/2009 1113   EOSABS 0.1 02/14/2009 1113   BASOSABS 0.0 02/14/2009 1113   CARDIAC ENZYMES Lab Results  Component Value Date   TROPONINI <0.30 12/12/2012    Scheduled Meds: . aspirin  324 mg Oral NOW   Or  . aspirin  300 mg Rectal NOW  . [START ON 12/13/2012] aspirin EC  81 mg Oral Daily  . atorvastatin  40 mg Oral q1800  . Ipratropium-Albuterol  2 puff Inhalation BID  . levothyroxine  200 mcg Oral QAC breakfast  . metoprolol tartrate  25 mg Oral BID  . pantoprazole  40 mg Oral Daily  . sodium chloride  3 mL Intravenous Q12H  . traZODone  100 mg Oral QHS   Continuous Infusions: . sodium chloride    . heparin 1,000 Units/hr (12/12/12 0056)   PRN Meds:.sodium chloride, acetaminophen, albuterol, ALPRAZolam, nitroGLYCERIN, ondansetron (ZOFRAN) IV, sodium chloride  Assessment/Plan: Chest pain R/O CAD COPD Hypothyroidism  Cardiac cath in AM. DC amlodipine. IV fluids Echocardiogram for LV function.   LOS: 0 days    Orpah Cobb  MD  12/12/2012, 9:20 AM

## 2012-12-13 NOTE — Interval H&P Note (Signed)
History and Physical Interval Note:  12/13/2012 7:28 AM  Carly Jennings  has presented today for surgery, with the diagnosis of Typical angina  The various methods of treatment have been discussed with the patient and family. After consideration of risks, benefits and other options for treatment, the patient has consented to  Procedure(s): LEFT HEART CATHETERIZATION WITH CORONARY ANGIOGRAM (N/A) as a surgical intervention .  The patient's history has been reviewed, patient examined, no change in status, stable for surgery.  I have reviewed the patient's chart and labs.  Questions were answered to the patient's satisfaction.     [name] S

## 2012-12-13 NOTE — Progress Notes (Addendum)
ANTICOAGULATION CONSULT NOTE - Follow Up Consult  Pharmacy Consult:  Heparin Indication: chest pain/ACS  Allergies  Allergen Reactions  . Penicillins Anaphylaxis  . Packed Cells     Reaction to blood transfusion in 93    Patient Measurements: Height: 5\' 11"  (180.3 cm) Weight: 178 lb 2.1 oz (80.8 kg) IBW/kg (Calculated) : 70.8 Heparin Dosing Weight: 81 kg  Vital Signs: Temp: 98.6 F (37 C) (12/08 0700) Temp src: Oral (12/08 0700) BP: 97/44 mmHg (12/08 1100) Pulse Rate: 54 (12/08 0801)  Labs:  Recent Labs  12/12/12 0140 12/12/12 0655 12/12/12 0900 12/12/12 1650 12/13/12 0508  HGB 12.4  --   --   --  11.2*  HCT 37.5  --   --   --  34.4*  PLT 230  --   --   --  197  LABPROT 13.2  --   --   --  13.2  INR 1.02  --   --   --  1.02  HEPARINUNFRC  --   --  0.52  --  0.37  CREATININE 0.70  --   --   --  0.82  TROPONINI <0.30 <0.30  --  <0.30  --     Estimated Creatinine Clearance: 71.4 ml/min (by C-G formula based on Cr of 0.82).      Assessment: 24 YOF admitted with chest pain to continue on IV heparin.  Heparin level therapeutic.  No further bleeding reported.  Note plan for cath today.   Goal of Therapy:  Heparin level 0.3-0.7 units/ml Monitor platelets by anticoagulation protocol: Yes    Plan:  - Continue IV heparin at 1100 units/hr - Daily HL / CBC - F/U post cath    Shyler Hamill D. Laney Potash, PharmD, BCPS Pager:  [phone] 12/13/2012, 11:25 AM  ==========================================  Addendum: - patient went to cath in early AM - heparin gtt has been off since 0700   Plan: - f/u with order to d/c heparin gtt (still on profile) - f/u cath note    Eunice Winecoff D. Laney Potash, PharmD, BCPS Pager:  [phone] 12/13/2012, 3:27 PM

## 2012-12-13 NOTE — Discharge Summary (Signed)
Physician Discharge Summary  Patient ID: Carly Jennings MRN: [id number] DOB/AGE: November 06, 1940 72 y.o.  Admit date: 12/12/2012 Discharge date: 12/13/2012  Admission Diagnoses: Chest pain  R/O CAD  COPD  Hypothyroidism  Discharge Diagnoses:  Principle Problem: * Chest pain * Early CAD  COPD  Hypothyroidism Anxiety Depression  Discharged Condition: fair  Hospital Course: 72 year old female was admitted with recurrent retrosternal chest pain associated with sweating spell and shortness of breath. She underwent cardiac catheterization with near normal coronaries. She was discharged home in stable condition with follow up by me in 1 week.   Consults: None  Significant Diagnostic Studies: labs: Normal CBC, BMET and cardiac enzymes. Normal Lipid panel with LDL cholesterol of 74 mg/dL and HDL of 53 mg/dL Cardiac cath near normal. EKG-NSR, left axis deviation non-specific T wave changes.  Treatments: IV hydration, IV heparin, cardiac meds: metoprolol, amlodipine and atorvastatin and alprazolam, albuterol nebulizer treatment.  Discharge Exam: Blood pressure 92/38, pulse 56, temperature 98.2 F (36.8 C), temperature source Oral, resp. rate 17, height 5\' 11"  (1.803 m), weight 80.8 kg (178 lb 2.1 oz), SpO2 98.00%.   Disposition: 01, Home, Self care.     Medication List    STOP taking these medications       aspirin EC 81 MG tablet      TAKE these medications       albuterol (2.5 MG/3ML) 0.083% nebulizer solution  Commonly known as:  PROVENTIL  Take 3 mLs (2.5 mg total) by nebulization every 6 (six) hours as needed for wheezing or shortness of breath.     albuterol-ipratropium 18-103 MCG/ACT inhaler  Commonly known as:  COMBIVENT  Inhale 2 puffs into the lungs every 6 (six) hours as needed for wheezing or shortness of breath.     ALPRAZolam 0.25 MG tablet  Commonly known as:  XANAX  Take 1 tablet (0.25 mg total) by mouth 2 (two) times daily as needed for anxiety.     amLODipine 2.5 MG tablet  Commonly known as:  NORVASC  Take 1 tablet (2.5 mg total) by mouth daily.     atorvastatin 40 MG tablet  Commonly known as:  LIPITOR  Take 1 tablet (40 mg total) by mouth daily.     diphenhydrAMINE 25 MG tablet  Commonly known as:  BENADRYL  Take 1 tablet (25 mg total) by mouth as needed for allergies.     Fluticasone-Salmeterol 100-50 MCG/DOSE Aepb  Commonly known as:  ADVAIR  Inhale 1 puff into the lungs 2 (two) times daily.     levothyroxine 200 MCG tablet  Commonly known as:  SYNTHROID, LEVOTHROID  Take 1 tablet (200 mcg total) by mouth daily before breakfast.     lubiprostone 24 MCG capsule  Commonly known as:  AMITIZA  Take 1 capsule (24 mcg total) by mouth 2 (two) times daily with a meal.     meclizine 25 MG tablet  Commonly known as:  ANTIVERT  Take 1 tablet (25 mg total) by mouth 3 (three) times daily as needed for dizziness.     metoprolol tartrate 25 MG tablet  Commonly known as:  LOPRESSOR  Take 1 tablet (25 mg total) by mouth 2 (two) times daily.     ranitidine 75 MG tablet  Commonly known as:  ZANTAC  Take 1 tablet (75 mg total) by mouth daily as needed for heartburn.     traZODone 100 MG tablet  Commonly known as:  DESYREL  Take 1 tablet (100 mg total) by  mouth 2 (two) times daily.           Follow-up Information   Follow up with [facility], MD. Schedule an appointment as soon as possible for a visit in 1 week.   Specialty:  Cardiology   Contact information:   [address] [place] [address] [phone]       Signed: Ricki Rodriguez 12/13/2012, 6:06 PM

## 2012-12-13 NOTE — Progress Notes (Signed)
Spoke to Dr Algie Coffer about the discharge instructions. Patient is to see md in the office this week to modify her medicines.   Carly Jennings, Carly Dalton RN

## 2012-12-13 NOTE — Progress Notes (Signed)
Utilization Review Completed.Vitoria Conyer T12/08/2012  

## 2013-01-06 HISTORY — PX: CATARACT EXTRACTION, BILATERAL: SHX1313

## 2013-05-06 HISTORY — PX: PARTIAL HIP ARTHROPLASTY: SHX733

## 2013-12-15 ENCOUNTER — Encounter (HOSPITAL_COMMUNITY): Payer: Self-pay | Admitting: Cardiovascular Disease

## 2014-08-22 ENCOUNTER — Observation Stay (HOSPITAL_COMMUNITY): Payer: PPO

## 2014-08-22 ENCOUNTER — Encounter (HOSPITAL_COMMUNITY): Payer: Self-pay | Admitting: General Practice

## 2014-08-22 ENCOUNTER — Observation Stay (HOSPITAL_COMMUNITY)
Admission: AD | Admit: 2014-08-22 | Discharge: 2014-08-23 | Disposition: A | Discharge status: Home / Self Care | Payer: PPO | Source: Ambulatory Visit | Attending: Cardiovascular Disease | Admitting: Cardiovascular Disease

## 2014-08-22 DIAGNOSIS — D638 Anemia in other chronic diseases classified elsewhere: Secondary | ICD-10-CM | POA: Insufficient documentation

## 2014-08-22 DIAGNOSIS — E876 Hypokalemia: Secondary | ICD-10-CM | POA: Insufficient documentation

## 2014-08-22 DIAGNOSIS — R55 Syncope and collapse: Principal | ICD-10-CM

## 2014-08-22 DIAGNOSIS — I6522 Occlusion and stenosis of left carotid artery: Secondary | ICD-10-CM | POA: Insufficient documentation

## 2014-08-22 DIAGNOSIS — E039 Hypothyroidism, unspecified: Secondary | ICD-10-CM | POA: Diagnosis not present

## 2014-08-22 DIAGNOSIS — E785 Hyperlipidemia, unspecified: Secondary | ICD-10-CM | POA: Insufficient documentation

## 2014-08-22 DIAGNOSIS — Z880 Allergy status to penicillin: Secondary | ICD-10-CM | POA: Insufficient documentation

## 2014-08-22 DIAGNOSIS — Z6820 Body mass index (BMI) 20.0-20.9, adult: Secondary | ICD-10-CM | POA: Insufficient documentation

## 2014-08-22 DIAGNOSIS — J449 Chronic obstructive pulmonary disease, unspecified: Secondary | ICD-10-CM | POA: Diagnosis not present

## 2014-08-22 DIAGNOSIS — Z859 Personal history of malignant neoplasm, unspecified: Secondary | ICD-10-CM | POA: Diagnosis not present

## 2014-08-22 DIAGNOSIS — I2510 Atherosclerotic heart disease of native coronary artery without angina pectoris: Secondary | ICD-10-CM | POA: Diagnosis not present

## 2014-08-22 DIAGNOSIS — F17210 Nicotine dependence, cigarettes, uncomplicated: Secondary | ICD-10-CM | POA: Insufficient documentation

## 2014-08-22 DIAGNOSIS — J439 Emphysema, unspecified: Secondary | ICD-10-CM | POA: Diagnosis present

## 2014-08-22 DIAGNOSIS — E440 Moderate protein-calorie malnutrition: Secondary | ICD-10-CM | POA: Insufficient documentation

## 2014-08-22 DIAGNOSIS — F419 Anxiety disorder, unspecified: Secondary | ICD-10-CM | POA: Diagnosis not present

## 2014-08-22 DIAGNOSIS — I252 Old myocardial infarction: Secondary | ICD-10-CM | POA: Insufficient documentation

## 2014-08-22 DIAGNOSIS — Z888 Allergy status to other drugs, medicaments and biological substances status: Secondary | ICD-10-CM | POA: Diagnosis not present

## 2014-08-22 DIAGNOSIS — Z79899 Other long term (current) drug therapy: Secondary | ICD-10-CM | POA: Insufficient documentation

## 2014-08-22 HISTORY — DX: Personal history of other medical treatment: Z92.89

## 2014-08-22 HISTORY — DX: Hypothyroidism, unspecified: E03.9

## 2014-08-22 HISTORY — DX: Syncope and collapse: R55

## 2014-08-22 HISTORY — DX: Unspecified osteoarthritis, unspecified site: M19.90

## 2014-08-22 HISTORY — DX: Pure hypercholesterolemia, unspecified: E78.00

## 2014-08-22 HISTORY — DX: Pneumonia, unspecified organism: J18.9

## 2014-08-22 HISTORY — DX: Family history of other specified conditions: Z84.89

## 2014-08-22 HISTORY — DX: Gastro-esophageal reflux disease without esophagitis: K21.9

## 2014-08-22 HISTORY — DX: Malignant neoplasm of cervix uteri, unspecified: C53.9

## 2014-08-22 HISTORY — DX: Dependence on supplemental oxygen: Z99.81

## 2014-08-22 LAB — URINALYSIS, ROUTINE W REFLEX MICROSCOPIC
BILIRUBIN URINE: NEGATIVE
Glucose, UA: NEGATIVE mg/dL
HGB URINE DIPSTICK: NEGATIVE
Ketones, ur: NEGATIVE mg/dL
Leukocytes, UA: NEGATIVE
Nitrite: NEGATIVE
PH: 5.5 (ref 5.0–8.0)
Protein, ur: NEGATIVE mg/dL
SPECIFIC GRAVITY, URINE: 1.014 (ref 1.005–1.030)
UROBILINOGEN UA: 1 mg/dL (ref 0.0–1.0)

## 2014-08-22 LAB — CBC WITH DIFFERENTIAL/PLATELET
BASOS PCT: 1 % (ref 0–1)
Basophils Absolute: 0 10*3/uL (ref 0.0–0.1)
Eosinophils Absolute: 0.2 10*3/uL (ref 0.0–0.7)
Eosinophils Relative: 2 % (ref 0–5)
HEMATOCRIT: 34.5 % — AB (ref 36.0–46.0)
HEMOGLOBIN: 11.6 g/dL — AB (ref 12.0–15.0)
LYMPHS ABS: 0.9 10*3/uL (ref 0.7–4.0)
LYMPHS PCT: 12 % (ref 12–46)
MCH: 30.9 pg (ref 26.0–34.0)
MCHC: 33.6 g/dL (ref 30.0–36.0)
MCV: 91.8 fL (ref 78.0–100.0)
MONO ABS: 0.6 10*3/uL (ref 0.1–1.0)
MONOS PCT: 8 % (ref 3–12)
NEUTROS ABS: 5.9 10*3/uL (ref 1.7–7.7)
NEUTROS PCT: 77 % (ref 43–77)
Platelets: 222 10*3/uL (ref 150–400)
RBC: 3.76 MIL/uL — ABNORMAL LOW (ref 3.87–5.11)
RDW: 13.3 % (ref 11.5–15.5)
WBC: 7.6 10*3/uL (ref 4.0–10.5)

## 2014-08-22 LAB — COMPREHENSIVE METABOLIC PANEL
ALBUMIN: 3.3 g/dL — AB (ref 3.5–5.0)
ALK PHOS: 135 U/L — AB (ref 38–126)
ALT: 55 U/L — ABNORMAL HIGH (ref 14–54)
ANION GAP: 11 (ref 5–15)
AST: 40 U/L (ref 15–41)
BUN: 10 mg/dL (ref 6–20)
CALCIUM: 8.4 mg/dL — AB (ref 8.9–10.3)
CHLORIDE: 102 mmol/L (ref 101–111)
CO2: 27 mmol/L (ref 22–32)
Creatinine, Ser: 0.73 mg/dL (ref 0.44–1.00)
GFR calc non Af Amer: >60 mL/min (ref >60)
GLUCOSE: 93 mg/dL (ref 65–99)
Potassium: 2.9 mmol/L — ABNORMAL LOW (ref 3.5–5.1)
SODIUM: 140 mmol/L (ref 135–145)
Total Bilirubin: 0.5 mg/dL (ref 0.3–1.2)
Total Protein: 6.3 g/dL — ABNORMAL LOW (ref 6.5–8.1)

## 2014-08-22 LAB — TSH: TSH: 9.123 u[IU]/mL — AB (ref 0.350–4.500)

## 2014-08-22 MED ORDER — ALUM & MAG HYDROXIDE-SIMETH 200-200-20 MG/5ML PO SUSP: 30.0000 mL | Freq: Four times a day (QID) | ORAL | Status: DC | PRN

## 2014-08-22 MED ORDER — SODIUM CHLORIDE 0.9 % IJ SOLN
3.0000 mL | Freq: Two times a day (BID) | INTRAMUSCULAR | Status: DC
Start: 2014-08-22 — End: 2014-08-23
  Administered 2014-08-22 – 2014-08-23 (×2): 3 mL via INTRAVENOUS

## 2014-08-22 MED ORDER — ONDANSETRON HCL 4 MG PO TABS: 4.0000 mg | ORAL_TABLET | Freq: Four times a day (QID) | ORAL | Status: DC | PRN

## 2014-08-22 MED ORDER — ASPIRIN EC 81 MG PO TBEC
81.0000 mg | DELAYED_RELEASE_TABLET | Freq: Every day | ORAL | Status: DC
  Administered 2014-08-23: 81 mg via ORAL
  Filled 2014-08-22: qty 1

## 2014-08-22 MED ORDER — ACETAMINOPHEN 650 MG RE SUPP
650.0000 mg | Freq: Four times a day (QID) | RECTAL | Status: DC | PRN
  Filled 2014-08-22: qty 1

## 2014-08-22 MED ORDER — ACETAMINOPHEN 325 MG PO TABS: 650.0000 mg | ORAL_TABLET | Freq: Four times a day (QID) | ORAL | Status: DC | PRN

## 2014-08-22 MED ORDER — ONDANSETRON HCL 4 MG/2ML IJ SOLN: 4.0000 mg | Freq: Four times a day (QID) | INTRAMUSCULAR | Status: DC | PRN

## 2014-08-22 MED ORDER — OXYCODONE HCL 5 MG PO TABS
5.0000 mg | ORAL_TABLET | ORAL | Status: DC | PRN
  Administered 2014-08-22: 5 mg via ORAL
  Filled 2014-08-22: qty 1

## 2014-08-22 MED ORDER — ADULT MULTIVITAMIN W/MINERALS CH
1.0000 | ORAL_TABLET | Freq: Every day | ORAL | Status: DC
  Administered 2014-08-23: 1 via ORAL
  Filled 2014-08-22: qty 1

## 2014-08-22 MED ORDER — HEPARIN SODIUM (PORCINE) 5000 UNIT/ML IJ SOLN
5000.0000 [IU] | Freq: Three times a day (TID) | INTRAMUSCULAR | Status: DC
Start: 2014-08-22 — End: 2014-08-23
  Administered 2014-08-22 – 2014-08-23 (×2): 5000 [IU] via SUBCUTANEOUS
  Filled 2014-08-22 (×4): qty 1

## 2014-08-22 MED ORDER — DOCUSATE SODIUM 100 MG PO CAPS
100.0000 mg | ORAL_CAPSULE | Freq: Two times a day (BID) | ORAL | Status: DC
  Administered 2014-08-22 – 2014-08-23 (×2): 100 mg via ORAL
  Filled 2014-08-22 (×3): qty 1

## 2014-08-22 MED ORDER — SODIUM CHLORIDE 0.9 % IV SOLN
INTRAVENOUS | Status: DC
  Administered 2014-08-22: 17:00:00 via INTRAVENOUS

## 2014-08-22 MED ORDER — ENSURE ENLIVE PO LIQD
237.0000 mL | Freq: Two times a day (BID) | ORAL | Status: DC
  Administered 2014-08-23: 237 mL via ORAL

## 2014-08-22 NOTE — Progress Notes (Signed)
Patient has arrived to unit as direct admit to room 3E06. Patient alert and oriented x4 with no complaints at this time. Patient placed on cardiac monitor, CCMD notified. Patient oriented to unit and room. Call bell and telephone in reach. Dr Doylene Canard made aware of patients arrival

## 2014-08-22 NOTE — H&P (Signed)
Referring Physician:  JERRY HAUGEN is an 74 y.o. female.                       Chief Complaint: Syncope  HPI: 74 year old female with PMH of End stage COPD, hypothyroidism, smoking, abnormal weight loss had syncopal episode 5 days back lasting for few minutes. No chest pain or palpitation.    Past Medical History  Diagnosis Date  . Anginal pain   . Coronary artery disease   . Myocardial infarction   . COPD (chronic obstructive pulmonary disease)   . Cancer       Past Surgical History  Procedure Laterality Date  . Tonsillectomy    . Other surgical history      adnoidectomy  . Left heart catheterization with coronary angiogram N/A 12/13/2012    Procedure: LEFT HEART CATHETERIZATION WITH CORONARY ANGIOGRAM;  Surgeon: Birdie Riddle, MD;  Location: [facility]ATH [facility];  Service: Cardiovascular;  Laterality: N/A;    No family history on file. Social History:  reports that she has been smoking Cigarettes.  She does not have any smokeless tobacco history on file. She reports that she does not drink alcohol or use illicit drugs.  Allergies:  Allergies  Allergen Reactions  . Penicillins Anaphylaxis  . Packed Cells     Reaction to blood transfusion in 93    Medications Prior to Admission  Medication Sig Dispense Refill  . albuterol (PROVENTIL) (2.5 MG/3ML) 0.083% nebulizer solution Take 3 mLs (2.5 mg total) by nebulization every 6 (six) hours as needed for wheezing or shortness of breath. 75 mL 0  . albuterol-ipratropium (COMBIVENT) 18-103 MCG/ACT inhaler Inhale 2 puffs into the lungs every 6 (six) hours as needed for wheezing or shortness of breath.    . ALPRAZolam (XANAX) 0.25 MG tablet Take 1 tablet (0.25 mg total) by mouth 2 (two) times daily as needed for anxiety. 30 tablet 0  . amLODipine (NORVASC) 2.5 MG tablet Take 1 tablet (2.5 mg total) by mouth daily.    Marland Kitchen atorvastatin (LIPITOR) 40 MG tablet Take 1 tablet (40 mg total) by mouth daily.    . diphenhydrAMINE (BENADRYL) 25 MG  tablet Take 1 tablet (25 mg total) by mouth as needed for allergies. 30 tablet 0  . Fluticasone-Salmeterol (ADVAIR) 100-50 MCG/DOSE AEPB Inhale 1 puff into the lungs 2 (two) times daily. 60 each   . levothyroxine (SYNTHROID, LEVOTHROID) 200 MCG tablet Take 1 tablet (200 mcg total) by mouth daily before breakfast.    . lubiprostone (AMITIZA) 24 MCG capsule Take 1 capsule (24 mcg total) by mouth 2 (two) times daily with a meal.    . meclizine (ANTIVERT) 25 MG tablet Take 1 tablet (25 mg total) by mouth 3 (three) times daily as needed for dizziness. 30 tablet 0  . metoprolol tartrate (LOPRESSOR) 25 MG tablet Take 1 tablet (25 mg total) by mouth 2 (two) times daily. 60 tablet 3  . ranitidine (ZANTAC) 75 MG tablet Take 1 tablet (75 mg total) by mouth daily as needed for heartburn.    . traZODone (DESYREL) 100 MG tablet Take 1 tablet (100 mg total) by mouth 2 (two) times daily.      No results found for this or any previous visit (from the past 48 hour(s)). No results found.  Review Of Systems No weight gain, loss. Wears glasses, No hearing loss. No GI or GU bleed. No stroke or seizures. + COPD, + Chest pain  Blood pressure 116/74,  pulse 74, temperature 97.5 F (36.4 C), temperature source Oral, resp. rate 18, height 5' 11.5" (1.816 m), weight 65.726 kg (144 lb 14.4 oz), SpO2 100 %. General appearance: alert, appears stated age and mild respiratory distress Head: Normocephalic, without obvious abnormality, atraumatic Eyes: conjunctivae/corneas clear. PERRL, EOM's intact. Neck: no adenopathy, no carotid bruit, no JVD, thyroid not palpable Resp: clear to auscultation bilaterally Cardio: regular rate and rhythm, S1, S2 normal, no click, rub or gallop. II/VI systolic murmur. GI: soft, non-tender; bowel sounds normal; no masses, no organomegaly Extremities: extremities normal, atraumatic, no cyanosis or edema Skin: Warm and dry.  Assessment/Plan Syncope COPD with  emphysema Hypothyroidism Protein calorie malnutrition, severe Tobacco use disorder Dyslipidemia  Place in observation Telemetry Echocardiogram Carotid doppler Home medications.  Birdie Riddle, MD  08/22/2014, 4:27 PM

## 2014-08-23 ENCOUNTER — Ambulatory Visit (HOSPITAL_COMMUNITY): Payer: PPO

## 2014-08-23 DIAGNOSIS — E440 Moderate protein-calorie malnutrition: Secondary | ICD-10-CM | POA: Insufficient documentation

## 2014-08-23 DIAGNOSIS — R55 Syncope and collapse: Secondary | ICD-10-CM | POA: Diagnosis not present

## 2014-08-23 LAB — BASIC METABOLIC PANEL
ANION GAP: 8 (ref 5–15)
BUN: 17 mg/dL (ref 6–20)
CHLORIDE: 105 mmol/L (ref 101–111)
CO2: 27 mmol/L (ref 22–32)
Calcium: 8.1 mg/dL — ABNORMAL LOW (ref 8.9–10.3)
Creatinine, Ser: 1.11 mg/dL — ABNORMAL HIGH (ref 0.44–1.00)
GFR calc Af Amer: 57 mL/min — ABNORMAL LOW (ref >60)
GFR, EST NON AFRICAN AMERICAN: 49 mL/min — AB (ref >60)
GLUCOSE: 89 mg/dL (ref 65–99)
POTASSIUM: 3.2 mmol/L — AB (ref 3.5–5.1)
Sodium: 140 mmol/L (ref 135–145)

## 2014-08-23 LAB — FERRITIN: Ferritin: 165 ng/mL (ref 11–307)

## 2014-08-23 LAB — IRON AND TIBC
Iron: 34 ug/dL (ref 28–170)
SATURATION RATIOS: 16 % (ref 10.4–31.8)
TIBC: 216 ug/dL — AB (ref 250–450)
UIBC: 182 ug/dL

## 2014-08-23 LAB — CBC
HEMATOCRIT: 32.6 % — AB (ref 36.0–46.0)
HEMOGLOBIN: 10.8 g/dL — AB (ref 12.0–15.0)
MCH: 30.3 pg (ref 26.0–34.0)
MCHC: 33.1 g/dL (ref 30.0–36.0)
MCV: 91.3 fL (ref 78.0–100.0)
Platelets: 204 10*3/uL (ref 150–400)
RBC: 3.57 MIL/uL — AB (ref 3.87–5.11)
RDW: 13.4 % (ref 11.5–15.5)
WBC: 5.5 10*3/uL (ref 4.0–10.5)

## 2014-08-23 LAB — PROTIME-INR
INR: 1.07 (ref 0.00–1.49)
PROTHROMBIN TIME: 14.1 s (ref 11.6–15.2)

## 2014-08-23 MED ORDER — LUBIPROSTONE 24 MCG PO CAPS: 24.0000 ug | ORAL_CAPSULE | Freq: Every day | ORAL | Status: AC | PRN

## 2014-08-23 MED ORDER — POTASSIUM CHLORIDE ER 20 MEQ PO TBCR: 10.0000 meq | EXTENDED_RELEASE_TABLET | Freq: Every day | ORAL | Status: AC

## 2014-08-23 MED ORDER — ENSURE ENLIVE PO LIQD: 60.0000 | Freq: Two times a day (BID) | ORAL | Status: AC

## 2014-08-23 MED ORDER — ASPIRIN 81 MG PO TBEC: 81.0000 mg | DELAYED_RELEASE_TABLET | Freq: Every day | ORAL | Status: AC

## 2014-08-23 MED ORDER — POTASSIUM CHLORIDE ER 10 MEQ PO TBCR
20.0000 meq | EXTENDED_RELEASE_TABLET | Freq: Three times a day (TID) | ORAL | Status: DC
  Administered 2014-08-23: 20 meq via ORAL
  Filled 2014-08-23 (×3): qty 2

## 2014-08-23 NOTE — Progress Notes (Signed)
Initial Nutrition Assessment  DOCUMENTATION CODES:   Non-severe (moderate) malnutrition in context of chronic illness  INTERVENTION:  Provide Magic cup BID with meals, each supplement provides 290 kcal and 9 grams of protein Provide a snack once daily Provided and discussed "Suggestions for Increasing Calories and Protein" handout from the Academy of Nutrition and Dietetics   NUTRITION DIAGNOSIS:   Malnutrition related to poor appetite as evidenced by energy intake < 75% for > or equal to 1 month, moderate depletions of muscle mass, moderate depletion of body fat.   GOAL:   Patient will meet greater than or equal to 90% of their needs   MONITOR:   PO intake, Supplement acceptance, Labs, Weight trends, Skin  REASON FOR ASSESSMENT:   Malnutrition Screening Tool    ASSESSMENT:   74 year old female with PMH of End stage COPD, hypothyroidism, smoking, abnormal weight loss had syncopal episode 5 days back lasting for few minutes.  Patient states that she used to weigh 183 lbs about 2 years ago; over the past couple years her appetite has gradually decreased and she has been eating less and less. She states she usually eats toast or nothing for breakfast, a half a sandwich for lunch, and a small plate for dinner. She has noticed weight loss and has been feeling weak. Moderate fat and muscle wasting noted per physical exam. Pt has tried Ensure and Boost Breeze supplements but, dislikes them. RD discussed the importance of nutrition and adequate calorie/protein intake. Provided and discussed "Suggestions for Increasing Calories and Protein" handout from the Academy of Nutrition and Dietetics. Pt agreeable to receiving Magic cup ice cream with meal trays and snacks between meals.   Labs: low potassium, low calcium, low hemoglobin   Diet Order:  Diet Heart Room service appropriate?: Yes; Fluid consistency:: Thin  Skin:  Reviewed, no issues  Last BM:  8/15  Height:   Ht Readings  from Last 1 Encounters:  08/22/14 5' 11.5" (1.816 m)    Weight:   Wt Readings from Last 1 Encounters:  08/23/14 147 lb 0.7 oz (66.698 kg)    Ideal Body Weight:  71.6 kg  BMI:  Body mass index is 20.22 kg/(m^2).  Estimated Nutritional Needs:   Kcal:  1600-1800  Protein:  90-100 grams  Fluid:  1.6-1.8 L/day  EDUCATION NEEDS:   Education needs addressed  Scarlette Ar RD, LDN Inpatient Clinical Dietitian Pager: [phone] After Hours Pager: [phone]

## 2014-08-23 NOTE — Discharge Summary (Signed)
Physician Discharge Summary  Patient ID: Carly Jennings MRN: [id number] DOB/AGE: [birth year] 74 y.o.  Admit date: 08/22/2014 Discharge date: 08/23/2014  Admission Diagnoses: Syncope COPD with emphysema Hypothyroidism Protein calorie malnutrition, severe Tobacco use disorder Dyslipidemia  Discharge Diagnoses:  Principal Problem:   Syncope Active Problems:   COPD (chronic obstructive pulmonary disease) with emphysema   Malnutrition of moderate degree Hypothyroidism Tobacco use disorder Dyslipidemia Anxiety Anemia of chronic disease Hypokalemia-improving  Discharged Condition: fair  Hospital Course: 74 year old female with PMH of End stage COPD, hypothyroidism, smoking, abnormal weight loss had syncopal episode 5 days back lasting for few minutes. No chest pain or palpitation. Her MRA, carotid doppler and echocardiogram were without significant acute finding. She was advised to use walker and physical therapy at home to improve gait and food supplements to improve malnutrition. She will be followed by me in 2 weeks and then by primary care.  Consults: cardiology  Significant Diagnostic Studies: labs: Near normal CBC, BMET except Hgb of 10.8 and K+ of 2.9 to 3.2 meq.   Echocardiogram: Left ventricle: The cavity size was normal. There was mild concentric hypertrophy. Systolic function was normal. Theestimated ejection fraction was in the range of 60% to 65%. Wall motion was normal; there were no regional wall motion abnormalities. Doppler parameters are consistent with abnormal left ventricular relaxation (grade 1 diastolic dysfunction).  Carotid Duplex: Right : 1% to 39% ICA stenosis. Left : 40-59% internal carotid artery stenosis. Bilateral - Vertebral artery flow is antegrade.  MRA head: Negative intracranial MRA.  EKG-Monitor-sinus rhythm.  Treatments: IV hydration and nutritional consult  Discharge Exam: Blood pressure 117/61, pulse 73, temperature 97.1 F (36.2  C), temperature source Oral, resp. rate 20, height 5' 11.5" (1.816 m), weight 66.698 kg (147 lb 0.7 oz), SpO2 93 %. General appearance: alert, appears stated age and mild respiratory distress. Well built and poorly nourished Head: Normocephalic, with small right forehead scalp lipoma noted. Atraumatic. Eyes: conjunctivae/corneas clear. PERRL, EOM's intact. Neck: no adenopathy, no carotid bruit, no JVD, thyroid not palpable Resp: clear to auscultation bilaterally Cardio: regular rate and rhythm, S1, S2 normal, no click, rub or gallop. II/VI systolic murmur. GI: soft, non-tender; bowel sounds normal; no masses, no organomegaly Extremities: extremities normal, atraumatic, no cyanosis or edema Skin: Warm and dry.  Disposition: 01-Home or Self Care     Medication List    STOP taking these medications        ALPRAZolam 0.25 MG tablet  Commonly known as:  XANAX      TAKE these medications        albuterol (2.5 MG/3ML) 0.083% nebulizer solution  Commonly known as:  PROVENTIL  Take 3 mLs (2.5 mg total) by nebulization every 6 (six) hours as needed for wheezing or shortness of breath.     albuterol-ipratropium 18-103 MCG/ACT inhaler  Commonly known as:  COMBIVENT  Inhale 2 puffs into the lungs every 6 (six) hours as needed for wheezing or shortness of breath.     amLODipine 2.5 MG tablet  Commonly known as:  NORVASC  Take 1 tablet (2.5 mg total) by mouth daily.     aspirin 81 MG EC tablet  Take 1 tablet (81 mg total) by mouth daily.     atorvastatin 40 MG tablet  Commonly known as:  LIPITOR  Take 1 tablet (40 mg total) by mouth daily.     diphenhydrAMINE 25 MG tablet  Commonly known as:  BENADRYL  Take 1 tablet (25 mg total) by  mouth as needed for allergies.     feeding supplement (ENSURE ENLIVE) Liqd  Take 14,220 mLs by mouth 2 (two) times daily between meals.     Fluticasone-Salmeterol 100-50 MCG/DOSE Aepb  Commonly known as:  ADVAIR  Inhale 1 puff into the lungs 2  (two) times daily.     levothyroxine 200 MCG tablet  Commonly known as:  SYNTHROID, LEVOTHROID  Take 1 tablet (200 mcg total) by mouth daily before breakfast.     lubiprostone 24 MCG capsule  Commonly known as:  AMITIZA  Take 1 capsule (24 mcg total) by mouth daily as needed (stomach issues).     meclizine 25 MG tablet  Commonly known as:  ANTIVERT  Take 1 tablet (25 mg total) by mouth 3 (three) times daily as needed for dizziness.     metoprolol tartrate 25 MG tablet  Commonly known as:  LOPRESSOR  Take 1 tablet (25 mg total) by mouth 2 (two) times daily.     Potassium Chloride ER 20 MEQ Tbcr  Take 10 mEq by mouth daily.     PRESCRIPTION MEDICATION  Place 1 drop into the right eye 2 (two) times daily.     ranitidine 75 MG tablet  Commonly known as:  ZANTAC  Take 1 tablet (75 mg total) by mouth daily as needed for heartburn.     traZODone 100 MG tablet  Commonly known as:  DESYREL  Take 1 tablet (100 mg total) by mouth 2 (two) times daily.     Vitamin D (Ergocalciferol) 50000 UNITS Caps capsule  Commonly known as:  DRISDOL  Take 50,000 Units by mouth every 7 (seven) days.           Follow-up Information    Follow up with Wende Neighbors, MD. Schedule an appointment as soon as possible for a visit in 1 month.   Specialty:  Family Medicine   Contact information:   [address] [place] [address] [phone]       Follow up with [facility] S, MD. Schedule an appointment as soon as possible for a visit in 2 weeks.   Specialty:  Cardiology   Contact information:   [place] [address] [phone]       Signed: Birdie Riddle 08/23/2014, 4:04 PM

## 2014-08-23 NOTE — Progress Notes (Signed)
  Echocardiogram 2D Echocardiogram has been performed.  Carly Jennings 08/23/2014, 3:01 PM

## 2014-08-23 NOTE — Progress Notes (Signed)
VASCULAR LAB PRELIMINARY  PRELIMINARY  PRELIMINARY  PRELIMINARY  Carotid duplex completed.    Preliminary report:  Right : 1% to 39% ICA stenosis. Left :  40-59% internal carotid artery stenosis.  Bilateral - Vertebral artery flow is antegrade.  Jaylea Plourde, RVS 08/23/2014, 3:28 PM

## 2015-01-10 DIAGNOSIS — J449 Chronic obstructive pulmonary disease, unspecified: Secondary | ICD-10-CM | POA: Diagnosis not present

## 2015-01-16 DIAGNOSIS — J449 Chronic obstructive pulmonary disease, unspecified: Secondary | ICD-10-CM | POA: Diagnosis not present

## 2015-01-16 DIAGNOSIS — M25559 Pain in unspecified hip: Secondary | ICD-10-CM | POA: Diagnosis not present

## 2015-01-16 DIAGNOSIS — I10 Essential (primary) hypertension: Secondary | ICD-10-CM | POA: Diagnosis not present

## 2015-02-10 DIAGNOSIS — J449 Chronic obstructive pulmonary disease, unspecified: Secondary | ICD-10-CM | POA: Diagnosis not present

## 2015-03-10 DIAGNOSIS — J449 Chronic obstructive pulmonary disease, unspecified: Secondary | ICD-10-CM | POA: Diagnosis not present

## 2015-03-18 DIAGNOSIS — M25512 Pain in left shoulder: Secondary | ICD-10-CM | POA: Diagnosis not present

## 2015-03-18 DIAGNOSIS — W19XXXA Unspecified fall, initial encounter: Secondary | ICD-10-CM | POA: Diagnosis not present

## 2015-03-18 DIAGNOSIS — S42035A Nondisplaced fracture of lateral end of left clavicle, initial encounter for closed fracture: Secondary | ICD-10-CM | POA: Diagnosis not present

## 2015-03-18 DIAGNOSIS — I10 Essential (primary) hypertension: Secondary | ICD-10-CM | POA: Diagnosis not present

## 2015-03-18 DIAGNOSIS — I2510 Atherosclerotic heart disease of native coronary artery without angina pectoris: Secondary | ICD-10-CM | POA: Diagnosis not present

## 2015-03-18 DIAGNOSIS — S42002A Fracture of unspecified part of left clavicle, initial encounter for closed fracture: Secondary | ICD-10-CM | POA: Diagnosis not present

## 2015-03-18 DIAGNOSIS — F17210 Nicotine dependence, cigarettes, uncomplicated: Secondary | ICD-10-CM | POA: Diagnosis not present

## 2015-03-18 DIAGNOSIS — Z7982 Long term (current) use of aspirin: Secondary | ICD-10-CM | POA: Diagnosis not present

## 2015-03-20 DIAGNOSIS — E538 Deficiency of other specified B group vitamins: Secondary | ICD-10-CM | POA: Diagnosis not present

## 2015-03-20 DIAGNOSIS — J449 Chronic obstructive pulmonary disease, unspecified: Secondary | ICD-10-CM | POA: Diagnosis not present

## 2015-03-20 DIAGNOSIS — E559 Vitamin D deficiency, unspecified: Secondary | ICD-10-CM | POA: Diagnosis not present

## 2015-03-20 DIAGNOSIS — K219 Gastro-esophageal reflux disease without esophagitis: Secondary | ICD-10-CM | POA: Diagnosis not present

## 2015-03-20 DIAGNOSIS — S42009A Fracture of unspecified part of unspecified clavicle, initial encounter for closed fracture: Secondary | ICD-10-CM | POA: Diagnosis not present

## 2015-03-20 DIAGNOSIS — E038 Other specified hypothyroidism: Secondary | ICD-10-CM | POA: Diagnosis not present

## 2015-03-20 DIAGNOSIS — J189 Pneumonia, unspecified organism: Secondary | ICD-10-CM | POA: Diagnosis not present

## 2015-03-20 DIAGNOSIS — Z6821 Body mass index (BMI) 21.0-21.9, adult: Secondary | ICD-10-CM | POA: Diagnosis not present

## 2015-03-20 DIAGNOSIS — Z780 Asymptomatic menopausal state: Secondary | ICD-10-CM | POA: Diagnosis not present

## 2015-03-20 DIAGNOSIS — E785 Hyperlipidemia, unspecified: Secondary | ICD-10-CM | POA: Diagnosis not present

## 2015-04-04 DIAGNOSIS — S42035A Nondisplaced fracture of lateral end of left clavicle, initial encounter for closed fracture: Secondary | ICD-10-CM | POA: Diagnosis not present

## 2015-04-10 DIAGNOSIS — J449 Chronic obstructive pulmonary disease, unspecified: Secondary | ICD-10-CM | POA: Diagnosis not present

## 2015-05-10 DIAGNOSIS — J449 Chronic obstructive pulmonary disease, unspecified: Secondary | ICD-10-CM | POA: Diagnosis not present

## 2015-05-30 DIAGNOSIS — M7989 Other specified soft tissue disorders: Secondary | ICD-10-CM | POA: Diagnosis not present

## 2015-05-30 DIAGNOSIS — M79672 Pain in left foot: Secondary | ICD-10-CM | POA: Diagnosis not present

## 2015-05-30 DIAGNOSIS — F17200 Nicotine dependence, unspecified, uncomplicated: Secondary | ICD-10-CM | POA: Diagnosis not present

## 2015-06-10 DIAGNOSIS — J449 Chronic obstructive pulmonary disease, unspecified: Secondary | ICD-10-CM | POA: Diagnosis not present

## 2015-07-10 DIAGNOSIS — J449 Chronic obstructive pulmonary disease, unspecified: Secondary | ICD-10-CM | POA: Diagnosis not present

## 2015-08-10 DIAGNOSIS — J449 Chronic obstructive pulmonary disease, unspecified: Secondary | ICD-10-CM | POA: Diagnosis not present

## 2015-09-10 DIAGNOSIS — J449 Chronic obstructive pulmonary disease, unspecified: Secondary | ICD-10-CM | POA: Diagnosis not present

## 2015-09-25 DIAGNOSIS — E538 Deficiency of other specified B group vitamins: Secondary | ICD-10-CM | POA: Diagnosis not present

## 2015-09-25 DIAGNOSIS — Z6821 Body mass index (BMI) 21.0-21.9, adult: Secondary | ICD-10-CM | POA: Diagnosis not present

## 2015-09-25 DIAGNOSIS — Z79899 Other long term (current) drug therapy: Secondary | ICD-10-CM | POA: Diagnosis not present

## 2015-09-25 DIAGNOSIS — Z9181 History of falling: Secondary | ICD-10-CM | POA: Diagnosis not present

## 2015-09-25 DIAGNOSIS — E038 Other specified hypothyroidism: Secondary | ICD-10-CM | POA: Diagnosis not present

## 2015-09-25 DIAGNOSIS — I10 Essential (primary) hypertension: Secondary | ICD-10-CM | POA: Diagnosis not present

## 2015-09-25 DIAGNOSIS — J449 Chronic obstructive pulmonary disease, unspecified: Secondary | ICD-10-CM | POA: Diagnosis not present

## 2015-09-25 DIAGNOSIS — J189 Pneumonia, unspecified organism: Secondary | ICD-10-CM | POA: Diagnosis not present

## 2015-09-25 DIAGNOSIS — K219 Gastro-esophageal reflux disease without esophagitis: Secondary | ICD-10-CM | POA: Diagnosis not present

## 2015-09-25 DIAGNOSIS — E559 Vitamin D deficiency, unspecified: Secondary | ICD-10-CM | POA: Diagnosis not present

## 2015-09-25 DIAGNOSIS — E785 Hyperlipidemia, unspecified: Secondary | ICD-10-CM | POA: Diagnosis not present

## 2015-10-10 DIAGNOSIS — J449 Chronic obstructive pulmonary disease, unspecified: Secondary | ICD-10-CM | POA: Diagnosis not present

## 2015-11-02 DIAGNOSIS — J189 Pneumonia, unspecified organism: Secondary | ICD-10-CM | POA: Diagnosis not present

## 2015-11-02 DIAGNOSIS — G4700 Insomnia, unspecified: Secondary | ICD-10-CM | POA: Diagnosis not present

## 2015-11-02 DIAGNOSIS — I10 Essential (primary) hypertension: Secondary | ICD-10-CM | POA: Diagnosis not present

## 2015-11-02 DIAGNOSIS — E038 Other specified hypothyroidism: Secondary | ICD-10-CM | POA: Diagnosis not present

## 2015-11-02 DIAGNOSIS — K219 Gastro-esophageal reflux disease without esophagitis: Secondary | ICD-10-CM | POA: Diagnosis not present

## 2015-11-02 DIAGNOSIS — J449 Chronic obstructive pulmonary disease, unspecified: Secondary | ICD-10-CM | POA: Diagnosis not present

## 2015-11-02 DIAGNOSIS — E559 Vitamin D deficiency, unspecified: Secondary | ICD-10-CM | POA: Diagnosis not present

## 2015-11-02 DIAGNOSIS — E538 Deficiency of other specified B group vitamins: Secondary | ICD-10-CM | POA: Diagnosis not present

## 2015-11-02 DIAGNOSIS — E785 Hyperlipidemia, unspecified: Secondary | ICD-10-CM | POA: Diagnosis not present

## 2015-11-10 DIAGNOSIS — J449 Chronic obstructive pulmonary disease, unspecified: Secondary | ICD-10-CM | POA: Diagnosis not present

## 2015-12-06 DIAGNOSIS — I10 Essential (primary) hypertension: Secondary | ICD-10-CM | POA: Diagnosis not present

## 2015-12-06 DIAGNOSIS — R0602 Shortness of breath: Secondary | ICD-10-CM | POA: Diagnosis not present

## 2015-12-06 DIAGNOSIS — J441 Chronic obstructive pulmonary disease with (acute) exacerbation: Secondary | ICD-10-CM | POA: Diagnosis not present

## 2015-12-06 DIAGNOSIS — R918 Other nonspecific abnormal finding of lung field: Secondary | ICD-10-CM | POA: Diagnosis not present

## 2015-12-06 DIAGNOSIS — F17200 Nicotine dependence, unspecified, uncomplicated: Secondary | ICD-10-CM | POA: Diagnosis not present

## 2015-12-06 DIAGNOSIS — I2589 Other forms of chronic ischemic heart disease: Secondary | ICD-10-CM | POA: Diagnosis not present

## 2015-12-10 DIAGNOSIS — J449 Chronic obstructive pulmonary disease, unspecified: Secondary | ICD-10-CM | POA: Diagnosis not present

## 2016-01-08 DIAGNOSIS — Z6821 Body mass index (BMI) 21.0-21.9, adult: Secondary | ICD-10-CM | POA: Diagnosis not present

## 2016-01-08 DIAGNOSIS — E559 Vitamin D deficiency, unspecified: Secondary | ICD-10-CM | POA: Diagnosis not present

## 2016-01-08 DIAGNOSIS — J189 Pneumonia, unspecified organism: Secondary | ICD-10-CM | POA: Diagnosis not present

## 2016-01-08 DIAGNOSIS — K219 Gastro-esophageal reflux disease without esophagitis: Secondary | ICD-10-CM | POA: Diagnosis not present

## 2016-01-08 DIAGNOSIS — I10 Essential (primary) hypertension: Secondary | ICD-10-CM | POA: Diagnosis not present

## 2016-01-08 DIAGNOSIS — E538 Deficiency of other specified B group vitamins: Secondary | ICD-10-CM | POA: Diagnosis not present

## 2016-01-08 DIAGNOSIS — E063 Autoimmune thyroiditis: Secondary | ICD-10-CM | POA: Diagnosis not present

## 2016-01-08 DIAGNOSIS — E785 Hyperlipidemia, unspecified: Secondary | ICD-10-CM | POA: Diagnosis not present

## 2016-01-08 DIAGNOSIS — J449 Chronic obstructive pulmonary disease, unspecified: Secondary | ICD-10-CM | POA: Diagnosis not present

## 2016-02-08 DIAGNOSIS — Z6820 Body mass index (BMI) 20.0-20.9, adult: Secondary | ICD-10-CM | POA: Diagnosis not present

## 2016-02-08 DIAGNOSIS — J101 Influenza due to other identified influenza virus with other respiratory manifestations: Secondary | ICD-10-CM | POA: Diagnosis not present

## 2016-02-08 DIAGNOSIS — J189 Pneumonia, unspecified organism: Secondary | ICD-10-CM | POA: Diagnosis not present

## 2016-02-08 DIAGNOSIS — J449 Chronic obstructive pulmonary disease, unspecified: Secondary | ICD-10-CM | POA: Diagnosis not present

## 2016-02-08 DIAGNOSIS — R110 Nausea: Secondary | ICD-10-CM | POA: Diagnosis not present

## 2016-02-08 DIAGNOSIS — R6889 Other general symptoms and signs: Secondary | ICD-10-CM | POA: Diagnosis not present

## 2016-02-29 DIAGNOSIS — J449 Chronic obstructive pulmonary disease, unspecified: Secondary | ICD-10-CM | POA: Diagnosis not present

## 2016-03-28 DIAGNOSIS — J449 Chronic obstructive pulmonary disease, unspecified: Secondary | ICD-10-CM | POA: Diagnosis not present

## 2016-04-09 DIAGNOSIS — J449 Chronic obstructive pulmonary disease, unspecified: Secondary | ICD-10-CM | POA: Diagnosis not present

## 2016-04-24 DIAGNOSIS — Z6820 Body mass index (BMI) 20.0-20.9, adult: Secondary | ICD-10-CM | POA: Diagnosis not present

## 2016-04-24 DIAGNOSIS — E538 Deficiency of other specified B group vitamins: Secondary | ICD-10-CM | POA: Diagnosis not present

## 2016-04-24 DIAGNOSIS — G4700 Insomnia, unspecified: Secondary | ICD-10-CM | POA: Diagnosis not present

## 2016-04-24 DIAGNOSIS — Z780 Asymptomatic menopausal state: Secondary | ICD-10-CM | POA: Diagnosis not present

## 2016-04-24 DIAGNOSIS — J449 Chronic obstructive pulmonary disease, unspecified: Secondary | ICD-10-CM | POA: Diagnosis not present

## 2016-04-24 DIAGNOSIS — J189 Pneumonia, unspecified organism: Secondary | ICD-10-CM | POA: Diagnosis not present

## 2016-04-24 DIAGNOSIS — Z1231 Encounter for screening mammogram for malignant neoplasm of breast: Secondary | ICD-10-CM | POA: Diagnosis not present

## 2016-04-24 DIAGNOSIS — E063 Autoimmune thyroiditis: Secondary | ICD-10-CM | POA: Diagnosis not present

## 2016-04-24 DIAGNOSIS — E785 Hyperlipidemia, unspecified: Secondary | ICD-10-CM | POA: Diagnosis not present

## 2016-04-24 DIAGNOSIS — K219 Gastro-esophageal reflux disease without esophagitis: Secondary | ICD-10-CM | POA: Diagnosis not present

## 2016-04-24 DIAGNOSIS — I10 Essential (primary) hypertension: Secondary | ICD-10-CM | POA: Diagnosis not present

## 2016-04-24 DIAGNOSIS — E559 Vitamin D deficiency, unspecified: Secondary | ICD-10-CM | POA: Diagnosis not present

## 2016-05-09 DIAGNOSIS — J449 Chronic obstructive pulmonary disease, unspecified: Secondary | ICD-10-CM | POA: Diagnosis not present

## 2016-05-14 DIAGNOSIS — M810 Age-related osteoporosis without current pathological fracture: Secondary | ICD-10-CM | POA: Diagnosis not present

## 2016-05-14 DIAGNOSIS — Z780 Asymptomatic menopausal state: Secondary | ICD-10-CM | POA: Diagnosis not present

## 2016-05-14 DIAGNOSIS — Z1231 Encounter for screening mammogram for malignant neoplasm of breast: Secondary | ICD-10-CM | POA: Diagnosis not present

## 2016-06-28 DIAGNOSIS — J449 Chronic obstructive pulmonary disease, unspecified: Secondary | ICD-10-CM | POA: Diagnosis not present

## 2016-07-28 DIAGNOSIS — J449 Chronic obstructive pulmonary disease, unspecified: Secondary | ICD-10-CM | POA: Diagnosis not present

## 2016-08-05 DIAGNOSIS — R0781 Pleurodynia: Secondary | ICD-10-CM | POA: Diagnosis not present

## 2016-08-05 DIAGNOSIS — J189 Pneumonia, unspecified organism: Secondary | ICD-10-CM | POA: Diagnosis not present

## 2016-08-05 DIAGNOSIS — Z681 Body mass index (BMI) 19 or less, adult: Secondary | ICD-10-CM | POA: Diagnosis not present

## 2016-08-05 DIAGNOSIS — E538 Deficiency of other specified B group vitamins: Secondary | ICD-10-CM | POA: Diagnosis not present

## 2016-08-05 DIAGNOSIS — J449 Chronic obstructive pulmonary disease, unspecified: Secondary | ICD-10-CM | POA: Diagnosis not present

## 2016-08-16 DIAGNOSIS — Z7982 Long term (current) use of aspirin: Secondary | ICD-10-CM | POA: Diagnosis not present

## 2016-08-16 DIAGNOSIS — Z8541 Personal history of malignant neoplasm of cervix uteri: Secondary | ICD-10-CM | POA: Diagnosis not present

## 2016-08-16 DIAGNOSIS — Z880 Allergy status to penicillin: Secondary | ICD-10-CM | POA: Diagnosis not present

## 2016-08-16 DIAGNOSIS — M25511 Pain in right shoulder: Secondary | ICD-10-CM | POA: Diagnosis not present

## 2016-08-16 DIAGNOSIS — S4991XA Unspecified injury of right shoulder and upper arm, initial encounter: Secondary | ICD-10-CM | POA: Diagnosis not present

## 2016-08-16 DIAGNOSIS — J449 Chronic obstructive pulmonary disease, unspecified: Secondary | ICD-10-CM | POA: Diagnosis not present

## 2016-08-16 DIAGNOSIS — S72141A Displaced intertrochanteric fracture of right femur, initial encounter for closed fracture: Secondary | ICD-10-CM | POA: Diagnosis not present

## 2016-08-16 DIAGNOSIS — D62 Acute posthemorrhagic anemia: Secondary | ICD-10-CM | POA: Diagnosis not present

## 2016-08-16 DIAGNOSIS — S79919A Unspecified injury of unspecified hip, initial encounter: Secondary | ICD-10-CM | POA: Diagnosis not present

## 2016-08-16 DIAGNOSIS — I10 Essential (primary) hypertension: Secondary | ICD-10-CM | POA: Diagnosis not present

## 2016-08-16 DIAGNOSIS — S329XXA Fracture of unspecified parts of lumbosacral spine and pelvis, initial encounter for closed fracture: Secondary | ICD-10-CM | POA: Diagnosis not present

## 2016-08-16 DIAGNOSIS — Z4789 Encounter for other orthopedic aftercare: Secondary | ICD-10-CM | POA: Diagnosis not present

## 2016-08-16 DIAGNOSIS — E039 Hypothyroidism, unspecified: Secondary | ICD-10-CM | POA: Diagnosis not present

## 2016-08-16 DIAGNOSIS — S42302A Unspecified fracture of shaft of humerus, left arm, initial encounter for closed fracture: Secondary | ICD-10-CM | POA: Diagnosis not present

## 2016-08-16 DIAGNOSIS — E785 Hyperlipidemia, unspecified: Secondary | ICD-10-CM | POA: Diagnosis not present

## 2016-08-16 DIAGNOSIS — Z9049 Acquired absence of other specified parts of digestive tract: Secondary | ICD-10-CM | POA: Diagnosis not present

## 2016-08-16 DIAGNOSIS — I2699 Other pulmonary embolism without acute cor pulmonale: Secondary | ICD-10-CM | POA: Diagnosis not present

## 2016-08-16 DIAGNOSIS — Z743 Need for continuous supervision: Secondary | ICD-10-CM | POA: Diagnosis not present

## 2016-08-16 DIAGNOSIS — Z87891 Personal history of nicotine dependence: Secondary | ICD-10-CM | POA: Diagnosis not present

## 2016-08-16 DIAGNOSIS — I2510 Atherosclerotic heart disease of native coronary artery without angina pectoris: Secondary | ICD-10-CM | POA: Diagnosis not present

## 2016-08-16 DIAGNOSIS — M21921 Unspecified acquired deformity of right upper arm: Secondary | ICD-10-CM | POA: Diagnosis not present

## 2016-08-16 DIAGNOSIS — Z90710 Acquired absence of both cervix and uterus: Secondary | ICD-10-CM | POA: Diagnosis not present

## 2016-08-16 DIAGNOSIS — S7011XA Contusion of right thigh, initial encounter: Secondary | ICD-10-CM | POA: Diagnosis not present

## 2016-08-16 DIAGNOSIS — D649 Anemia, unspecified: Secondary | ICD-10-CM | POA: Diagnosis not present

## 2016-08-16 DIAGNOSIS — Z888 Allergy status to other drugs, medicaments and biological substances status: Secondary | ICD-10-CM | POA: Diagnosis not present

## 2016-08-16 DIAGNOSIS — S299XXA Unspecified injury of thorax, initial encounter: Secondary | ICD-10-CM | POA: Diagnosis not present

## 2016-08-27 ENCOUNTER — Other Ambulatory Visit: Payer: Self-pay

## 2016-08-27 NOTE — Patient Outreach (Signed)
[facility]) Care Management  08/27/2016  Carly Jennings Nov 12, 1940 [id number]  Transition of care  Week # Referral date: 08/27/16 Referral source:  Insurance plan referral: transition of care status post hospital discharge 08/22/16 Program Insurance: health team advantage Providers: Social support  Telephone call to patient regarding transition of care follow up. Contact answering phone states patient is not at home.  HIPAA compliant message left with call back phone number.   PLAN: RNCM will attempt 2nd telephone call to patient within 3 business days.sd   Quinn Plowman RN,BSN,CCM [facility] Telephonic  [phone]

## 2016-08-28 DIAGNOSIS — J449 Chronic obstructive pulmonary disease, unspecified: Secondary | ICD-10-CM | POA: Diagnosis not present

## 2016-08-29 DIAGNOSIS — S72141D Displaced intertrochanteric fracture of right femur, subsequent encounter for closed fracture with routine healing: Secondary | ICD-10-CM | POA: Diagnosis not present

## 2016-09-01 ENCOUNTER — Ambulatory Visit: Payer: Self-pay

## 2016-09-01 ENCOUNTER — Other Ambulatory Visit: Payer: Self-pay

## 2016-09-01 NOTE — Patient Outreach (Signed)
[facility]) Care Management  09/01/2016  Carly Jennings 06-02-1940 [id number]   Transition of care  Week # Referral date: 08/27/16 Referral source:  Insurance plan referral: transition of care status post hospital discharge 08/22/16 Program Insurance: health team advantage Providers: Social support: Attempt #2  Second telephone call to patient regarding  Transition of care outreach.  Unable to reach patient. HIPAA compliant voice message left with call back phone number.   PLAN; RNCM will attempt 3rd telephone call to patient within 3 business days.   Quinn Plowman RN,BSN,CCM [facility] Telephonic  [phone]

## 2016-09-02 ENCOUNTER — Other Ambulatory Visit: Payer: Self-pay

## 2016-09-02 NOTE — Patient Outreach (Signed)
[name]  09/02/2016  Carly Jennings 1940/03/31 [id number]   Transition of care  Week # Referral date: 08/27/16 Referral source:  Insurance plan referral: transition of care status post hospital discharge 08/22/16 Program Insurance: health team advantage Providers: Social support Attempt #3  Third telephone call to patient regarding transition of care follow up. Contact answering phone states patient is not at home.  HIPAA compliant message left with call back phone number.   PLAN: RNCM will send patient outreach letter to attempt contact.  Quinn Plowman RN,BSN,CCM [facility] Telephonic  [phone]

## 2016-09-11 DIAGNOSIS — M25551 Pain in right hip: Secondary | ICD-10-CM | POA: Diagnosis not present

## 2016-09-15 DIAGNOSIS — M25551 Pain in right hip: Secondary | ICD-10-CM | POA: Diagnosis not present

## 2016-09-16 ENCOUNTER — Other Ambulatory Visit: Payer: Self-pay

## 2016-09-16 NOTE — Patient Outreach (Signed)
[facility]) Care Management  09/16/2016  Carly Jennings October 24, 1940 [id number]   Transition of care / Case Closure Week # Referral date: 08/27/16 Referral source: Insurance plan referral: transition of care status post hospital discharge 08/22/16 Program Insurance: health team advantage Providers: Social support  No response from patient after 3 telephone calls and outreach letter attempt.  PLAN; RNCM will refer patient to care management assistant to close due to being unable to reach.  RNCM will notify patients primary MD of closure.   Quinn Plowman RN,BSN,CCM [facility] Telephonic  [phone]

## 2016-09-18 DIAGNOSIS — M25551 Pain in right hip: Secondary | ICD-10-CM | POA: Diagnosis not present

## 2016-09-22 DIAGNOSIS — M25551 Pain in right hip: Secondary | ICD-10-CM | POA: Diagnosis not present

## 2016-09-25 DIAGNOSIS — M25561 Pain in right knee: Secondary | ICD-10-CM | POA: Diagnosis not present

## 2016-09-25 DIAGNOSIS — S83411A Sprain of medial collateral ligament of right knee, initial encounter: Secondary | ICD-10-CM | POA: Diagnosis not present

## 2016-09-25 DIAGNOSIS — M25551 Pain in right hip: Secondary | ICD-10-CM | POA: Diagnosis not present

## 2016-09-25 DIAGNOSIS — S72141D Displaced intertrochanteric fracture of right femur, subsequent encounter for closed fracture with routine healing: Secondary | ICD-10-CM | POA: Diagnosis not present

## 2016-09-28 DIAGNOSIS — J449 Chronic obstructive pulmonary disease, unspecified: Secondary | ICD-10-CM | POA: Diagnosis not present

## 2016-09-29 DIAGNOSIS — M25551 Pain in right hip: Secondary | ICD-10-CM | POA: Diagnosis not present

## 2016-10-02 DIAGNOSIS — M25551 Pain in right hip: Secondary | ICD-10-CM | POA: Diagnosis not present

## 2016-10-06 DIAGNOSIS — M25551 Pain in right hip: Secondary | ICD-10-CM | POA: Diagnosis not present

## 2016-10-09 DIAGNOSIS — M25551 Pain in right hip: Secondary | ICD-10-CM | POA: Diagnosis not present

## 2016-10-13 DIAGNOSIS — M25551 Pain in right hip: Secondary | ICD-10-CM | POA: Diagnosis not present

## 2016-10-16 DIAGNOSIS — M25551 Pain in right hip: Secondary | ICD-10-CM | POA: Diagnosis not present

## 2016-10-28 DIAGNOSIS — J449 Chronic obstructive pulmonary disease, unspecified: Secondary | ICD-10-CM | POA: Diagnosis not present

## 2016-11-20 DIAGNOSIS — M25559 Pain in unspecified hip: Secondary | ICD-10-CM | POA: Diagnosis not present

## 2016-11-20 DIAGNOSIS — E559 Vitamin D deficiency, unspecified: Secondary | ICD-10-CM | POA: Diagnosis not present

## 2016-11-20 DIAGNOSIS — M818 Other osteoporosis without current pathological fracture: Secondary | ICD-10-CM | POA: Diagnosis not present

## 2016-11-20 DIAGNOSIS — E538 Deficiency of other specified B group vitamins: Secondary | ICD-10-CM | POA: Diagnosis not present

## 2016-11-20 DIAGNOSIS — Z681 Body mass index (BMI) 19 or less, adult: Secondary | ICD-10-CM | POA: Diagnosis not present

## 2016-11-20 DIAGNOSIS — I10 Essential (primary) hypertension: Secondary | ICD-10-CM | POA: Diagnosis not present

## 2016-11-20 DIAGNOSIS — K219 Gastro-esophageal reflux disease without esophagitis: Secondary | ICD-10-CM | POA: Diagnosis not present

## 2016-11-20 DIAGNOSIS — E063 Autoimmune thyroiditis: Secondary | ICD-10-CM | POA: Diagnosis not present

## 2016-11-20 DIAGNOSIS — J449 Chronic obstructive pulmonary disease, unspecified: Secondary | ICD-10-CM | POA: Diagnosis not present

## 2016-11-20 DIAGNOSIS — E785 Hyperlipidemia, unspecified: Secondary | ICD-10-CM | POA: Diagnosis not present

## 2016-11-20 DIAGNOSIS — G4700 Insomnia, unspecified: Secondary | ICD-10-CM | POA: Diagnosis not present

## 2016-11-28 DIAGNOSIS — J449 Chronic obstructive pulmonary disease, unspecified: Secondary | ICD-10-CM | POA: Diagnosis not present

## 2016-12-01 DIAGNOSIS — S72001A Fracture of unspecified part of neck of right femur, initial encounter for closed fracture: Secondary | ICD-10-CM | POA: Diagnosis not present

## 2016-12-01 DIAGNOSIS — M25561 Pain in right knee: Secondary | ICD-10-CM | POA: Diagnosis not present

## 2016-12-02 DIAGNOSIS — I425 Other restrictive cardiomyopathy: Secondary | ICD-10-CM | POA: Diagnosis not present

## 2016-12-02 DIAGNOSIS — R0602 Shortness of breath: Secondary | ICD-10-CM | POA: Diagnosis not present

## 2016-12-02 DIAGNOSIS — I340 Nonrheumatic mitral (valve) insufficiency: Secondary | ICD-10-CM | POA: Diagnosis not present

## 2016-12-02 DIAGNOSIS — I2510 Atherosclerotic heart disease of native coronary artery without angina pectoris: Secondary | ICD-10-CM | POA: Diagnosis not present

## 2016-12-03 DIAGNOSIS — R0602 Shortness of breath: Secondary | ICD-10-CM | POA: Diagnosis not present

## 2016-12-03 DIAGNOSIS — F17210 Nicotine dependence, cigarettes, uncomplicated: Secondary | ICD-10-CM | POA: Diagnosis not present

## 2016-12-03 DIAGNOSIS — J449 Chronic obstructive pulmonary disease, unspecified: Secondary | ICD-10-CM | POA: Diagnosis not present

## 2016-12-03 DIAGNOSIS — I2510 Atherosclerotic heart disease of native coronary artery without angina pectoris: Secondary | ICD-10-CM | POA: Diagnosis not present

## 2016-12-03 DIAGNOSIS — R002 Palpitations: Secondary | ICD-10-CM | POA: Diagnosis not present

## 2016-12-28 DIAGNOSIS — J449 Chronic obstructive pulmonary disease, unspecified: Secondary | ICD-10-CM | POA: Diagnosis not present

## 2017-01-28 DIAGNOSIS — J449 Chronic obstructive pulmonary disease, unspecified: Secondary | ICD-10-CM | POA: Diagnosis not present

## 2017-02-19 DIAGNOSIS — H04123 Dry eye syndrome of bilateral lacrimal glands: Secondary | ICD-10-CM | POA: Diagnosis not present

## 2017-02-28 DIAGNOSIS — J449 Chronic obstructive pulmonary disease, unspecified: Secondary | ICD-10-CM | POA: Diagnosis not present

## 2017-03-25 DIAGNOSIS — E079 Disorder of thyroid, unspecified: Secondary | ICD-10-CM | POA: Diagnosis not present

## 2017-03-25 DIAGNOSIS — R05 Cough: Secondary | ICD-10-CM | POA: Diagnosis not present

## 2017-03-25 DIAGNOSIS — J45909 Unspecified asthma, uncomplicated: Secondary | ICD-10-CM | POA: Diagnosis not present

## 2017-03-25 DIAGNOSIS — J449 Chronic obstructive pulmonary disease, unspecified: Secondary | ICD-10-CM | POA: Diagnosis not present

## 2017-03-25 DIAGNOSIS — I10 Essential (primary) hypertension: Secondary | ICD-10-CM | POA: Diagnosis not present

## 2017-03-25 DIAGNOSIS — R0602 Shortness of breath: Secondary | ICD-10-CM | POA: Diagnosis not present

## 2017-03-25 DIAGNOSIS — Z79890 Hormone replacement therapy (postmenopausal): Secondary | ICD-10-CM | POA: Diagnosis not present

## 2017-03-25 DIAGNOSIS — Z7951 Long term (current) use of inhaled steroids: Secondary | ICD-10-CM | POA: Diagnosis not present

## 2017-03-25 DIAGNOSIS — E785 Hyperlipidemia, unspecified: Secondary | ICD-10-CM | POA: Diagnosis not present

## 2017-03-28 DIAGNOSIS — J449 Chronic obstructive pulmonary disease, unspecified: Secondary | ICD-10-CM | POA: Diagnosis not present

## 2017-04-28 DIAGNOSIS — J449 Chronic obstructive pulmonary disease, unspecified: Secondary | ICD-10-CM | POA: Diagnosis not present

## 2017-05-28 DIAGNOSIS — J449 Chronic obstructive pulmonary disease, unspecified: Secondary | ICD-10-CM | POA: Diagnosis not present

## 2017-06-28 DIAGNOSIS — J449 Chronic obstructive pulmonary disease, unspecified: Secondary | ICD-10-CM | POA: Diagnosis not present

## 2017-07-21 DIAGNOSIS — E538 Deficiency of other specified B group vitamins: Secondary | ICD-10-CM | POA: Diagnosis not present

## 2017-07-21 DIAGNOSIS — E063 Autoimmune thyroiditis: Secondary | ICD-10-CM | POA: Diagnosis not present

## 2017-07-21 DIAGNOSIS — Z79899 Other long term (current) drug therapy: Secondary | ICD-10-CM | POA: Diagnosis not present

## 2017-07-21 DIAGNOSIS — J449 Chronic obstructive pulmonary disease, unspecified: Secondary | ICD-10-CM | POA: Diagnosis not present

## 2017-07-21 DIAGNOSIS — E785 Hyperlipidemia, unspecified: Secondary | ICD-10-CM | POA: Diagnosis not present

## 2017-07-21 DIAGNOSIS — E559 Vitamin D deficiency, unspecified: Secondary | ICD-10-CM | POA: Diagnosis not present

## 2017-07-21 DIAGNOSIS — Z9181 History of falling: Secondary | ICD-10-CM | POA: Diagnosis not present

## 2017-07-28 DIAGNOSIS — J449 Chronic obstructive pulmonary disease, unspecified: Secondary | ICD-10-CM | POA: Diagnosis not present

## 2017-08-28 DIAGNOSIS — J449 Chronic obstructive pulmonary disease, unspecified: Secondary | ICD-10-CM | POA: Diagnosis not present

## 2017-09-17 DIAGNOSIS — J189 Pneumonia, unspecified organism: Secondary | ICD-10-CM | POA: Diagnosis not present

## 2017-09-17 DIAGNOSIS — J449 Chronic obstructive pulmonary disease, unspecified: Secondary | ICD-10-CM | POA: Diagnosis not present

## 2017-09-17 DIAGNOSIS — E538 Deficiency of other specified B group vitamins: Secondary | ICD-10-CM | POA: Diagnosis not present

## 2017-12-10 DIAGNOSIS — E785 Hyperlipidemia, unspecified: Secondary | ICD-10-CM | POA: Diagnosis not present

## 2017-12-10 DIAGNOSIS — R918 Other nonspecific abnormal finding of lung field: Secondary | ICD-10-CM | POA: Diagnosis not present

## 2017-12-10 DIAGNOSIS — J439 Emphysema, unspecified: Secondary | ICD-10-CM | POA: Diagnosis not present

## 2017-12-10 DIAGNOSIS — I10 Essential (primary) hypertension: Secondary | ICD-10-CM | POA: Diagnosis not present

## 2017-12-10 DIAGNOSIS — Z7982 Long term (current) use of aspirin: Secondary | ICD-10-CM | POA: Diagnosis not present

## 2017-12-10 DIAGNOSIS — J440 Chronic obstructive pulmonary disease with acute lower respiratory infection: Secondary | ICD-10-CM | POA: Diagnosis not present

## 2017-12-10 DIAGNOSIS — Z7951 Long term (current) use of inhaled steroids: Secondary | ICD-10-CM | POA: Diagnosis not present

## 2017-12-10 DIAGNOSIS — E079 Disorder of thyroid, unspecified: Secondary | ICD-10-CM | POA: Diagnosis not present

## 2017-12-10 DIAGNOSIS — J189 Pneumonia, unspecified organism: Secondary | ICD-10-CM | POA: Diagnosis not present

## 2017-12-10 DIAGNOSIS — J90 Pleural effusion, not elsewhere classified: Secondary | ICD-10-CM | POA: Diagnosis not present

## 2018-03-10 DIAGNOSIS — E785 Hyperlipidemia, unspecified: Secondary | ICD-10-CM | POA: Diagnosis not present

## 2018-03-10 DIAGNOSIS — S6991XA Unspecified injury of right wrist, hand and finger(s), initial encounter: Secondary | ICD-10-CM | POA: Diagnosis not present

## 2018-03-10 DIAGNOSIS — E079 Disorder of thyroid, unspecified: Secondary | ICD-10-CM | POA: Diagnosis not present

## 2018-03-10 DIAGNOSIS — J449 Chronic obstructive pulmonary disease, unspecified: Secondary | ICD-10-CM | POA: Diagnosis not present

## 2018-03-10 DIAGNOSIS — S60221A Contusion of right hand, initial encounter: Secondary | ICD-10-CM | POA: Diagnosis not present

## 2018-03-10 DIAGNOSIS — Z7982 Long term (current) use of aspirin: Secondary | ICD-10-CM | POA: Diagnosis not present

## 2018-03-10 DIAGNOSIS — I10 Essential (primary) hypertension: Secondary | ICD-10-CM | POA: Diagnosis not present

## 2018-03-10 DIAGNOSIS — Z7951 Long term (current) use of inhaled steroids: Secondary | ICD-10-CM | POA: Diagnosis not present

## 2018-03-11 DIAGNOSIS — J449 Chronic obstructive pulmonary disease, unspecified: Secondary | ICD-10-CM | POA: Diagnosis not present

## 2018-03-11 DIAGNOSIS — E039 Hypothyroidism, unspecified: Secondary | ICD-10-CM | POA: Diagnosis not present

## 2018-03-11 DIAGNOSIS — Z6822 Body mass index (BMI) 22.0-22.9, adult: Secondary | ICD-10-CM | POA: Diagnosis not present

## 2018-03-11 DIAGNOSIS — R42 Dizziness and giddiness: Secondary | ICD-10-CM | POA: Diagnosis not present

## 2018-03-11 DIAGNOSIS — Z13228 Encounter for screening for other metabolic disorders: Secondary | ICD-10-CM | POA: Diagnosis not present

## 2018-03-16 DIAGNOSIS — J449 Chronic obstructive pulmonary disease, unspecified: Secondary | ICD-10-CM | POA: Diagnosis not present

## 2018-03-16 DIAGNOSIS — Z13228 Encounter for screening for other metabolic disorders: Secondary | ICD-10-CM | POA: Diagnosis not present

## 2018-03-16 DIAGNOSIS — E039 Hypothyroidism, unspecified: Secondary | ICD-10-CM | POA: Diagnosis not present

## 2018-03-16 DIAGNOSIS — Z13220 Encounter for screening for lipoid disorders: Secondary | ICD-10-CM | POA: Diagnosis not present

## 2018-03-18 DIAGNOSIS — Z13820 Encounter for screening for osteoporosis: Secondary | ICD-10-CM | POA: Diagnosis not present

## 2018-03-18 DIAGNOSIS — M810 Age-related osteoporosis without current pathological fracture: Secondary | ICD-10-CM | POA: Diagnosis not present

## 2018-04-08 DIAGNOSIS — E039 Hypothyroidism, unspecified: Secondary | ICD-10-CM | POA: Diagnosis not present

## 2018-04-08 DIAGNOSIS — Z720 Tobacco use: Secondary | ICD-10-CM | POA: Diagnosis not present

## 2018-04-08 DIAGNOSIS — J449 Chronic obstructive pulmonary disease, unspecified: Secondary | ICD-10-CM | POA: Diagnosis not present

## 2018-04-08 DIAGNOSIS — I10 Essential (primary) hypertension: Secondary | ICD-10-CM | POA: Diagnosis not present

## 2018-04-08 DIAGNOSIS — J309 Allergic rhinitis, unspecified: Secondary | ICD-10-CM | POA: Diagnosis not present

## 2018-04-24 DIAGNOSIS — E785 Hyperlipidemia, unspecified: Secondary | ICD-10-CM | POA: Diagnosis not present

## 2018-04-24 DIAGNOSIS — J449 Chronic obstructive pulmonary disease, unspecified: Secondary | ICD-10-CM | POA: Diagnosis not present

## 2018-04-24 DIAGNOSIS — Z7982 Long term (current) use of aspirin: Secondary | ICD-10-CM | POA: Diagnosis not present

## 2018-04-24 DIAGNOSIS — Z7951 Long term (current) use of inhaled steroids: Secondary | ICD-10-CM | POA: Diagnosis not present

## 2018-04-24 DIAGNOSIS — Z03818 Encounter for observation for suspected exposure to other biological agents ruled out: Secondary | ICD-10-CM | POA: Diagnosis not present

## 2018-04-24 DIAGNOSIS — I10 Essential (primary) hypertension: Secondary | ICD-10-CM | POA: Diagnosis not present

## 2018-04-24 DIAGNOSIS — B349 Viral infection, unspecified: Secondary | ICD-10-CM | POA: Diagnosis not present

## 2018-04-24 DIAGNOSIS — R918 Other nonspecific abnormal finding of lung field: Secondary | ICD-10-CM | POA: Diagnosis not present

## 2018-04-24 DIAGNOSIS — R0602 Shortness of breath: Secondary | ICD-10-CM | POA: Diagnosis not present

## 2018-04-24 DIAGNOSIS — E039 Hypothyroidism, unspecified: Secondary | ICD-10-CM | POA: Diagnosis not present

## 2018-05-10 DIAGNOSIS — J309 Allergic rhinitis, unspecified: Secondary | ICD-10-CM | POA: Diagnosis not present

## 2018-05-10 DIAGNOSIS — J449 Chronic obstructive pulmonary disease, unspecified: Secondary | ICD-10-CM | POA: Diagnosis not present

## 2018-05-10 DIAGNOSIS — E039 Hypothyroidism, unspecified: Secondary | ICD-10-CM | POA: Diagnosis not present

## 2018-05-10 DIAGNOSIS — M6281 Muscle weakness (generalized): Secondary | ICD-10-CM | POA: Diagnosis not present

## 2018-05-10 DIAGNOSIS — I10 Essential (primary) hypertension: Secondary | ICD-10-CM | POA: Diagnosis not present

## 2018-05-11 DIAGNOSIS — M6281 Muscle weakness (generalized): Secondary | ICD-10-CM | POA: Diagnosis not present

## 2018-05-11 DIAGNOSIS — I051 Rheumatic mitral insufficiency: Secondary | ICD-10-CM | POA: Diagnosis not present

## 2018-05-11 DIAGNOSIS — Z9181 History of falling: Secondary | ICD-10-CM | POA: Diagnosis not present

## 2018-05-11 DIAGNOSIS — J449 Chronic obstructive pulmonary disease, unspecified: Secondary | ICD-10-CM | POA: Diagnosis not present

## 2018-05-11 DIAGNOSIS — E039 Hypothyroidism, unspecified: Secondary | ICD-10-CM | POA: Diagnosis not present

## 2018-05-11 DIAGNOSIS — M810 Age-related osteoporosis without current pathological fracture: Secondary | ICD-10-CM | POA: Diagnosis not present

## 2018-05-12 DIAGNOSIS — M810 Age-related osteoporosis without current pathological fracture: Secondary | ICD-10-CM | POA: Diagnosis not present

## 2018-05-12 DIAGNOSIS — E039 Hypothyroidism, unspecified: Secondary | ICD-10-CM | POA: Diagnosis not present

## 2018-05-12 DIAGNOSIS — J449 Chronic obstructive pulmonary disease, unspecified: Secondary | ICD-10-CM | POA: Diagnosis not present

## 2018-05-12 DIAGNOSIS — I051 Rheumatic mitral insufficiency: Secondary | ICD-10-CM | POA: Diagnosis not present

## 2018-05-12 DIAGNOSIS — Z9181 History of falling: Secondary | ICD-10-CM | POA: Diagnosis not present

## 2018-05-12 DIAGNOSIS — M6281 Muscle weakness (generalized): Secondary | ICD-10-CM | POA: Diagnosis not present

## 2018-05-14 DIAGNOSIS — Z9181 History of falling: Secondary | ICD-10-CM | POA: Diagnosis not present

## 2018-05-14 DIAGNOSIS — J449 Chronic obstructive pulmonary disease, unspecified: Secondary | ICD-10-CM | POA: Diagnosis not present

## 2018-05-14 DIAGNOSIS — M810 Age-related osteoporosis without current pathological fracture: Secondary | ICD-10-CM | POA: Diagnosis not present

## 2018-05-14 DIAGNOSIS — I051 Rheumatic mitral insufficiency: Secondary | ICD-10-CM | POA: Diagnosis not present

## 2018-05-14 DIAGNOSIS — M6281 Muscle weakness (generalized): Secondary | ICD-10-CM | POA: Diagnosis not present

## 2018-05-14 DIAGNOSIS — E039 Hypothyroidism, unspecified: Secondary | ICD-10-CM | POA: Diagnosis not present

## 2018-05-17 DIAGNOSIS — J449 Chronic obstructive pulmonary disease, unspecified: Secondary | ICD-10-CM | POA: Diagnosis not present

## 2018-05-17 DIAGNOSIS — Z720 Tobacco use: Secondary | ICD-10-CM | POA: Diagnosis not present

## 2018-05-17 DIAGNOSIS — I10 Essential (primary) hypertension: Secondary | ICD-10-CM | POA: Diagnosis not present

## 2018-05-17 DIAGNOSIS — I959 Hypotension, unspecified: Secondary | ICD-10-CM | POA: Diagnosis not present

## 2018-05-17 DIAGNOSIS — M6281 Muscle weakness (generalized): Secondary | ICD-10-CM | POA: Diagnosis not present

## 2018-05-18 DIAGNOSIS — Z9181 History of falling: Secondary | ICD-10-CM | POA: Diagnosis not present

## 2018-05-18 DIAGNOSIS — M6281 Muscle weakness (generalized): Secondary | ICD-10-CM | POA: Diagnosis not present

## 2018-05-18 DIAGNOSIS — J449 Chronic obstructive pulmonary disease, unspecified: Secondary | ICD-10-CM | POA: Diagnosis not present

## 2018-05-18 DIAGNOSIS — E039 Hypothyroidism, unspecified: Secondary | ICD-10-CM | POA: Diagnosis not present

## 2018-05-18 DIAGNOSIS — I051 Rheumatic mitral insufficiency: Secondary | ICD-10-CM | POA: Diagnosis not present

## 2018-05-18 DIAGNOSIS — M810 Age-related osteoporosis without current pathological fracture: Secondary | ICD-10-CM | POA: Diagnosis not present

## 2018-05-19 DIAGNOSIS — I051 Rheumatic mitral insufficiency: Secondary | ICD-10-CM | POA: Diagnosis not present

## 2018-05-19 DIAGNOSIS — M6281 Muscle weakness (generalized): Secondary | ICD-10-CM | POA: Diagnosis not present

## 2018-05-19 DIAGNOSIS — M810 Age-related osteoporosis without current pathological fracture: Secondary | ICD-10-CM | POA: Diagnosis not present

## 2018-05-19 DIAGNOSIS — E039 Hypothyroidism, unspecified: Secondary | ICD-10-CM | POA: Diagnosis not present

## 2018-05-19 DIAGNOSIS — Z9181 History of falling: Secondary | ICD-10-CM | POA: Diagnosis not present

## 2018-05-19 DIAGNOSIS — J449 Chronic obstructive pulmonary disease, unspecified: Secondary | ICD-10-CM | POA: Diagnosis not present

## 2018-05-20 DIAGNOSIS — E039 Hypothyroidism, unspecified: Secondary | ICD-10-CM | POA: Diagnosis not present

## 2018-05-20 DIAGNOSIS — J449 Chronic obstructive pulmonary disease, unspecified: Secondary | ICD-10-CM | POA: Diagnosis not present

## 2018-05-20 DIAGNOSIS — M6281 Muscle weakness (generalized): Secondary | ICD-10-CM | POA: Diagnosis not present

## 2018-05-20 DIAGNOSIS — I051 Rheumatic mitral insufficiency: Secondary | ICD-10-CM | POA: Diagnosis not present

## 2018-05-20 DIAGNOSIS — M810 Age-related osteoporosis without current pathological fracture: Secondary | ICD-10-CM | POA: Diagnosis not present

## 2018-05-20 DIAGNOSIS — Z9181 History of falling: Secondary | ICD-10-CM | POA: Diagnosis not present

## 2018-05-21 DIAGNOSIS — M6281 Muscle weakness (generalized): Secondary | ICD-10-CM | POA: Diagnosis not present

## 2018-05-21 DIAGNOSIS — I051 Rheumatic mitral insufficiency: Secondary | ICD-10-CM | POA: Diagnosis not present

## 2018-05-21 DIAGNOSIS — J449 Chronic obstructive pulmonary disease, unspecified: Secondary | ICD-10-CM | POA: Diagnosis not present

## 2018-05-21 DIAGNOSIS — Z9181 History of falling: Secondary | ICD-10-CM | POA: Diagnosis not present

## 2018-05-21 DIAGNOSIS — E039 Hypothyroidism, unspecified: Secondary | ICD-10-CM | POA: Diagnosis not present

## 2018-05-21 DIAGNOSIS — M810 Age-related osteoporosis without current pathological fracture: Secondary | ICD-10-CM | POA: Diagnosis not present

## 2018-05-24 DIAGNOSIS — I10 Essential (primary) hypertension: Secondary | ICD-10-CM | POA: Diagnosis not present

## 2018-05-24 DIAGNOSIS — I959 Hypotension, unspecified: Secondary | ICD-10-CM | POA: Diagnosis not present

## 2018-05-25 DIAGNOSIS — M6281 Muscle weakness (generalized): Secondary | ICD-10-CM | POA: Diagnosis not present

## 2018-05-25 DIAGNOSIS — J449 Chronic obstructive pulmonary disease, unspecified: Secondary | ICD-10-CM | POA: Diagnosis not present

## 2018-05-25 DIAGNOSIS — Z9181 History of falling: Secondary | ICD-10-CM | POA: Diagnosis not present

## 2018-05-25 DIAGNOSIS — I051 Rheumatic mitral insufficiency: Secondary | ICD-10-CM | POA: Diagnosis not present

## 2018-05-25 DIAGNOSIS — E039 Hypothyroidism, unspecified: Secondary | ICD-10-CM | POA: Diagnosis not present

## 2018-05-25 DIAGNOSIS — M810 Age-related osteoporosis without current pathological fracture: Secondary | ICD-10-CM | POA: Diagnosis not present

## 2018-05-26 DIAGNOSIS — M6281 Muscle weakness (generalized): Secondary | ICD-10-CM | POA: Diagnosis not present

## 2018-05-26 DIAGNOSIS — M810 Age-related osteoporosis without current pathological fracture: Secondary | ICD-10-CM | POA: Diagnosis not present

## 2018-05-26 DIAGNOSIS — I051 Rheumatic mitral insufficiency: Secondary | ICD-10-CM | POA: Diagnosis not present

## 2018-05-26 DIAGNOSIS — E039 Hypothyroidism, unspecified: Secondary | ICD-10-CM | POA: Diagnosis not present

## 2018-05-26 DIAGNOSIS — Z9181 History of falling: Secondary | ICD-10-CM | POA: Diagnosis not present

## 2018-05-26 DIAGNOSIS — J449 Chronic obstructive pulmonary disease, unspecified: Secondary | ICD-10-CM | POA: Diagnosis not present

## 2018-06-01 DIAGNOSIS — E039 Hypothyroidism, unspecified: Secondary | ICD-10-CM | POA: Diagnosis not present

## 2018-06-01 DIAGNOSIS — Z9181 History of falling: Secondary | ICD-10-CM | POA: Diagnosis not present

## 2018-06-01 DIAGNOSIS — J449 Chronic obstructive pulmonary disease, unspecified: Secondary | ICD-10-CM | POA: Diagnosis not present

## 2018-06-01 DIAGNOSIS — M6281 Muscle weakness (generalized): Secondary | ICD-10-CM | POA: Diagnosis not present

## 2018-06-01 DIAGNOSIS — I051 Rheumatic mitral insufficiency: Secondary | ICD-10-CM | POA: Diagnosis not present

## 2018-06-01 DIAGNOSIS — M810 Age-related osteoporosis without current pathological fracture: Secondary | ICD-10-CM | POA: Diagnosis not present

## 2018-06-09 DIAGNOSIS — M6281 Muscle weakness (generalized): Secondary | ICD-10-CM | POA: Diagnosis not present

## 2018-06-09 DIAGNOSIS — J449 Chronic obstructive pulmonary disease, unspecified: Secondary | ICD-10-CM | POA: Diagnosis not present

## 2018-06-09 DIAGNOSIS — M810 Age-related osteoporosis without current pathological fracture: Secondary | ICD-10-CM | POA: Diagnosis not present

## 2018-06-09 DIAGNOSIS — I051 Rheumatic mitral insufficiency: Secondary | ICD-10-CM | POA: Diagnosis not present

## 2018-06-09 DIAGNOSIS — E039 Hypothyroidism, unspecified: Secondary | ICD-10-CM | POA: Diagnosis not present

## 2018-06-09 DIAGNOSIS — Z9181 History of falling: Secondary | ICD-10-CM | POA: Diagnosis not present

## 2018-09-24 DIAGNOSIS — E039 Hypothyroidism, unspecified: Secondary | ICD-10-CM | POA: Diagnosis not present

## 2018-09-24 DIAGNOSIS — J449 Chronic obstructive pulmonary disease, unspecified: Secondary | ICD-10-CM | POA: Diagnosis not present

## 2018-09-24 DIAGNOSIS — I10 Essential (primary) hypertension: Secondary | ICD-10-CM | POA: Diagnosis not present

## 2018-09-24 DIAGNOSIS — E785 Hyperlipidemia, unspecified: Secondary | ICD-10-CM | POA: Diagnosis not present

## 2018-11-03 DIAGNOSIS — E039 Hypothyroidism, unspecified: Secondary | ICD-10-CM | POA: Diagnosis not present

## 2018-11-03 DIAGNOSIS — Z23 Encounter for immunization: Secondary | ICD-10-CM | POA: Diagnosis not present

## 2018-11-03 DIAGNOSIS — R569 Unspecified convulsions: Secondary | ICD-10-CM | POA: Diagnosis not present

## 2018-11-03 DIAGNOSIS — Z0000 Encounter for general adult medical examination without abnormal findings: Secondary | ICD-10-CM | POA: Diagnosis not present

## 2018-11-03 DIAGNOSIS — J449 Chronic obstructive pulmonary disease, unspecified: Secondary | ICD-10-CM | POA: Diagnosis not present

## 2018-11-05 DIAGNOSIS — E785 Hyperlipidemia, unspecified: Secondary | ICD-10-CM | POA: Diagnosis not present

## 2018-11-05 DIAGNOSIS — E039 Hypothyroidism, unspecified: Secondary | ICD-10-CM | POA: Diagnosis not present

## 2018-11-05 DIAGNOSIS — J449 Chronic obstructive pulmonary disease, unspecified: Secondary | ICD-10-CM | POA: Diagnosis not present

## 2018-11-05 DIAGNOSIS — I10 Essential (primary) hypertension: Secondary | ICD-10-CM | POA: Diagnosis not present

## 2018-11-08 DIAGNOSIS — J449 Chronic obstructive pulmonary disease, unspecified: Secondary | ICD-10-CM | POA: Diagnosis not present

## 2018-11-08 DIAGNOSIS — I341 Nonrheumatic mitral (valve) prolapse: Secondary | ICD-10-CM | POA: Diagnosis not present

## 2018-11-08 DIAGNOSIS — Z96641 Presence of right artificial hip joint: Secondary | ICD-10-CM | POA: Diagnosis not present

## 2018-11-08 DIAGNOSIS — Z7951 Long term (current) use of inhaled steroids: Secondary | ICD-10-CM | POA: Diagnosis not present

## 2018-11-08 DIAGNOSIS — Z9181 History of falling: Secondary | ICD-10-CM | POA: Diagnosis not present

## 2018-11-08 DIAGNOSIS — I10 Essential (primary) hypertension: Secondary | ICD-10-CM | POA: Diagnosis not present

## 2018-11-08 DIAGNOSIS — Z902 Acquired absence of lung [part of]: Secondary | ICD-10-CM | POA: Diagnosis not present

## 2018-11-08 DIAGNOSIS — M6281 Muscle weakness (generalized): Secondary | ICD-10-CM | POA: Diagnosis not present

## 2018-11-08 DIAGNOSIS — R569 Unspecified convulsions: Secondary | ICD-10-CM | POA: Diagnosis not present

## 2018-11-08 DIAGNOSIS — Z96642 Presence of left artificial hip joint: Secondary | ICD-10-CM | POA: Diagnosis not present

## 2018-11-12 DIAGNOSIS — Z7951 Long term (current) use of inhaled steroids: Secondary | ICD-10-CM | POA: Diagnosis not present

## 2018-11-12 DIAGNOSIS — R569 Unspecified convulsions: Secondary | ICD-10-CM | POA: Diagnosis not present

## 2018-11-12 DIAGNOSIS — I341 Nonrheumatic mitral (valve) prolapse: Secondary | ICD-10-CM | POA: Diagnosis not present

## 2018-11-12 DIAGNOSIS — Z96642 Presence of left artificial hip joint: Secondary | ICD-10-CM | POA: Diagnosis not present

## 2018-11-12 DIAGNOSIS — J449 Chronic obstructive pulmonary disease, unspecified: Secondary | ICD-10-CM | POA: Diagnosis not present

## 2018-11-12 DIAGNOSIS — Z902 Acquired absence of lung [part of]: Secondary | ICD-10-CM | POA: Diagnosis not present

## 2018-11-12 DIAGNOSIS — Z96641 Presence of right artificial hip joint: Secondary | ICD-10-CM | POA: Diagnosis not present

## 2018-11-12 DIAGNOSIS — M6281 Muscle weakness (generalized): Secondary | ICD-10-CM | POA: Diagnosis not present

## 2018-11-12 DIAGNOSIS — I10 Essential (primary) hypertension: Secondary | ICD-10-CM | POA: Diagnosis not present

## 2018-11-12 DIAGNOSIS — Z9181 History of falling: Secondary | ICD-10-CM | POA: Diagnosis not present

## 2018-11-15 DIAGNOSIS — I10 Essential (primary) hypertension: Secondary | ICD-10-CM | POA: Diagnosis not present

## 2018-11-15 DIAGNOSIS — J449 Chronic obstructive pulmonary disease, unspecified: Secondary | ICD-10-CM | POA: Diagnosis not present

## 2018-11-15 DIAGNOSIS — Z96641 Presence of right artificial hip joint: Secondary | ICD-10-CM | POA: Diagnosis not present

## 2018-11-15 DIAGNOSIS — Z9181 History of falling: Secondary | ICD-10-CM | POA: Diagnosis not present

## 2018-11-15 DIAGNOSIS — Z902 Acquired absence of lung [part of]: Secondary | ICD-10-CM | POA: Diagnosis not present

## 2018-11-15 DIAGNOSIS — Z7951 Long term (current) use of inhaled steroids: Secondary | ICD-10-CM | POA: Diagnosis not present

## 2018-11-15 DIAGNOSIS — I341 Nonrheumatic mitral (valve) prolapse: Secondary | ICD-10-CM | POA: Diagnosis not present

## 2018-11-15 DIAGNOSIS — R569 Unspecified convulsions: Secondary | ICD-10-CM | POA: Diagnosis not present

## 2018-11-15 DIAGNOSIS — Z96642 Presence of left artificial hip joint: Secondary | ICD-10-CM | POA: Diagnosis not present

## 2018-11-15 DIAGNOSIS — M6281 Muscle weakness (generalized): Secondary | ICD-10-CM | POA: Diagnosis not present

## 2018-11-19 DIAGNOSIS — Z9181 History of falling: Secondary | ICD-10-CM | POA: Diagnosis not present

## 2018-11-19 DIAGNOSIS — M6281 Muscle weakness (generalized): Secondary | ICD-10-CM | POA: Diagnosis not present

## 2018-11-19 DIAGNOSIS — R569 Unspecified convulsions: Secondary | ICD-10-CM | POA: Diagnosis not present

## 2018-11-19 DIAGNOSIS — I341 Nonrheumatic mitral (valve) prolapse: Secondary | ICD-10-CM | POA: Diagnosis not present

## 2018-11-19 DIAGNOSIS — I10 Essential (primary) hypertension: Secondary | ICD-10-CM | POA: Diagnosis not present

## 2018-11-19 DIAGNOSIS — Z96641 Presence of right artificial hip joint: Secondary | ICD-10-CM | POA: Diagnosis not present

## 2018-11-19 DIAGNOSIS — J449 Chronic obstructive pulmonary disease, unspecified: Secondary | ICD-10-CM | POA: Diagnosis not present

## 2018-11-19 DIAGNOSIS — Z96642 Presence of left artificial hip joint: Secondary | ICD-10-CM | POA: Diagnosis not present

## 2018-11-19 DIAGNOSIS — Z7951 Long term (current) use of inhaled steroids: Secondary | ICD-10-CM | POA: Diagnosis not present

## 2018-11-19 DIAGNOSIS — Z902 Acquired absence of lung [part of]: Secondary | ICD-10-CM | POA: Diagnosis not present

## 2018-11-23 DIAGNOSIS — I341 Nonrheumatic mitral (valve) prolapse: Secondary | ICD-10-CM | POA: Diagnosis not present

## 2018-11-23 DIAGNOSIS — M6281 Muscle weakness (generalized): Secondary | ICD-10-CM | POA: Diagnosis not present

## 2018-11-23 DIAGNOSIS — Z9181 History of falling: Secondary | ICD-10-CM | POA: Diagnosis not present

## 2018-11-23 DIAGNOSIS — Z96641 Presence of right artificial hip joint: Secondary | ICD-10-CM | POA: Diagnosis not present

## 2018-11-23 DIAGNOSIS — Z96642 Presence of left artificial hip joint: Secondary | ICD-10-CM | POA: Diagnosis not present

## 2018-11-23 DIAGNOSIS — Z7951 Long term (current) use of inhaled steroids: Secondary | ICD-10-CM | POA: Diagnosis not present

## 2018-11-23 DIAGNOSIS — R569 Unspecified convulsions: Secondary | ICD-10-CM | POA: Diagnosis not present

## 2018-11-23 DIAGNOSIS — I10 Essential (primary) hypertension: Secondary | ICD-10-CM | POA: Diagnosis not present

## 2018-11-23 DIAGNOSIS — J449 Chronic obstructive pulmonary disease, unspecified: Secondary | ICD-10-CM | POA: Diagnosis not present

## 2018-11-23 DIAGNOSIS — Z902 Acquired absence of lung [part of]: Secondary | ICD-10-CM | POA: Diagnosis not present

## 2018-11-29 DIAGNOSIS — E785 Hyperlipidemia, unspecified: Secondary | ICD-10-CM | POA: Diagnosis not present

## 2018-11-29 DIAGNOSIS — J449 Chronic obstructive pulmonary disease, unspecified: Secondary | ICD-10-CM | POA: Diagnosis not present

## 2018-11-29 DIAGNOSIS — I10 Essential (primary) hypertension: Secondary | ICD-10-CM | POA: Diagnosis not present

## 2018-11-29 DIAGNOSIS — E559 Vitamin D deficiency, unspecified: Secondary | ICD-10-CM | POA: Diagnosis not present

## 2018-11-29 DIAGNOSIS — E039 Hypothyroidism, unspecified: Secondary | ICD-10-CM | POA: Diagnosis not present

## 2018-12-03 DIAGNOSIS — J449 Chronic obstructive pulmonary disease, unspecified: Secondary | ICD-10-CM | POA: Diagnosis not present

## 2018-12-03 DIAGNOSIS — M6281 Muscle weakness (generalized): Secondary | ICD-10-CM | POA: Diagnosis not present

## 2018-12-03 DIAGNOSIS — Z9181 History of falling: Secondary | ICD-10-CM | POA: Diagnosis not present

## 2018-12-03 DIAGNOSIS — I10 Essential (primary) hypertension: Secondary | ICD-10-CM | POA: Diagnosis not present

## 2018-12-03 DIAGNOSIS — Z96641 Presence of right artificial hip joint: Secondary | ICD-10-CM | POA: Diagnosis not present

## 2018-12-03 DIAGNOSIS — Z902 Acquired absence of lung [part of]: Secondary | ICD-10-CM | POA: Diagnosis not present

## 2018-12-03 DIAGNOSIS — Z96642 Presence of left artificial hip joint: Secondary | ICD-10-CM | POA: Diagnosis not present

## 2018-12-03 DIAGNOSIS — I341 Nonrheumatic mitral (valve) prolapse: Secondary | ICD-10-CM | POA: Diagnosis not present

## 2018-12-03 DIAGNOSIS — Z7951 Long term (current) use of inhaled steroids: Secondary | ICD-10-CM | POA: Diagnosis not present

## 2018-12-03 DIAGNOSIS — R569 Unspecified convulsions: Secondary | ICD-10-CM | POA: Diagnosis not present

## 2018-12-08 DIAGNOSIS — E559 Vitamin D deficiency, unspecified: Secondary | ICD-10-CM | POA: Diagnosis not present

## 2018-12-08 DIAGNOSIS — J209 Acute bronchitis, unspecified: Secondary | ICD-10-CM | POA: Diagnosis not present

## 2018-12-08 DIAGNOSIS — J449 Chronic obstructive pulmonary disease, unspecified: Secondary | ICD-10-CM | POA: Diagnosis not present

## 2018-12-08 DIAGNOSIS — E039 Hypothyroidism, unspecified: Secondary | ICD-10-CM | POA: Diagnosis not present

## 2018-12-08 DIAGNOSIS — R5382 Chronic fatigue, unspecified: Secondary | ICD-10-CM | POA: Diagnosis not present

## 2018-12-16 DIAGNOSIS — E039 Hypothyroidism, unspecified: Secondary | ICD-10-CM | POA: Diagnosis not present

## 2018-12-16 DIAGNOSIS — E785 Hyperlipidemia, unspecified: Secondary | ICD-10-CM | POA: Diagnosis not present

## 2018-12-16 DIAGNOSIS — I10 Essential (primary) hypertension: Secondary | ICD-10-CM | POA: Diagnosis not present

## 2018-12-16 DIAGNOSIS — J449 Chronic obstructive pulmonary disease, unspecified: Secondary | ICD-10-CM | POA: Diagnosis not present

## 2019-06-07 DEATH — deceased
